# Patient Record
Sex: Male | Born: 1965 | Race: White | Hispanic: No | Marital: Married | Smoking: Never smoker
Health system: Southern US, Community
[De-identification: ages and names within clinical notes are randomized; demographics above are authoritative.]

## PROBLEM LIST (undated history)

## (undated) DIAGNOSIS — E039 Hypothyroidism, unspecified: Secondary | ICD-10-CM

## (undated) DIAGNOSIS — M2550 Pain in unspecified joint: Secondary | ICD-10-CM

## (undated) DIAGNOSIS — K760 Fatty (change of) liver, not elsewhere classified: Secondary | ICD-10-CM

## (undated) DIAGNOSIS — F419 Anxiety disorder, unspecified: Secondary | ICD-10-CM

## (undated) DIAGNOSIS — M549 Dorsalgia, unspecified: Secondary | ICD-10-CM

## (undated) DIAGNOSIS — M7989 Other specified soft tissue disorders: Secondary | ICD-10-CM

## (undated) DIAGNOSIS — R12 Heartburn: Secondary | ICD-10-CM

## (undated) DIAGNOSIS — E7800 Pure hypercholesterolemia, unspecified: Secondary | ICD-10-CM

## (undated) DIAGNOSIS — N289 Disorder of kidney and ureter, unspecified: Secondary | ICD-10-CM

## (undated) DIAGNOSIS — K5900 Constipation, unspecified: Secondary | ICD-10-CM

## (undated) DIAGNOSIS — R0602 Shortness of breath: Secondary | ICD-10-CM

## (undated) DIAGNOSIS — N200 Calculus of kidney: Secondary | ICD-10-CM

## (undated) DIAGNOSIS — R079 Chest pain, unspecified: Secondary | ICD-10-CM

## (undated) HISTORY — DX: Pure hypercholesterolemia, unspecified: E78.00

## (undated) HISTORY — DX: Other specified soft tissue disorders: M79.89

## (undated) HISTORY — DX: Fatty (change of) liver, not elsewhere classified: K76.0

## (undated) HISTORY — DX: Shortness of breath: R06.02

## (undated) HISTORY — DX: Anxiety disorder, unspecified: F41.9

## (undated) HISTORY — DX: Dorsalgia, unspecified: M54.9

## (undated) HISTORY — DX: Pain in unspecified joint: M25.50

## (undated) HISTORY — DX: Disorder of kidney and ureter, unspecified: N28.9

## (undated) HISTORY — DX: Hypothyroidism, unspecified: E03.9

## (undated) HISTORY — DX: Heartburn: R12

## (undated) HISTORY — PX: APPENDECTOMY: SHX54

## (undated) HISTORY — DX: Constipation, unspecified: K59.00

## (undated) HISTORY — PX: LITHOTRIPSY: SUR834

## (undated) HISTORY — PX: HERNIA REPAIR: SHX51

## (undated) HISTORY — DX: Chest pain, unspecified: R07.9

---

## 1999-07-01 ENCOUNTER — Encounter: Payer: Self-pay | Admitting: Urology

## 1999-07-01 ENCOUNTER — Ambulatory Visit (HOSPITAL_COMMUNITY): Admission: RE | Admit: 1999-07-01 | Discharge: 1999-07-01 | Payer: Self-pay | Admitting: Urology

## 1999-07-20 ENCOUNTER — Encounter: Payer: Self-pay | Admitting: Urology

## 1999-07-20 ENCOUNTER — Encounter: Admission: RE | Admit: 1999-07-20 | Discharge: 1999-07-20 | Payer: Self-pay | Admitting: Urology

## 1999-11-01 ENCOUNTER — Encounter: Payer: Self-pay | Admitting: Urology

## 1999-11-01 ENCOUNTER — Encounter: Admission: RE | Admit: 1999-11-01 | Discharge: 1999-11-01 | Payer: Self-pay | Admitting: Urology

## 1999-11-09 ENCOUNTER — Encounter: Admission: RE | Admit: 1999-11-09 | Discharge: 1999-11-09 | Payer: Self-pay | Admitting: Urology

## 1999-11-09 ENCOUNTER — Encounter: Payer: Self-pay | Admitting: Urology

## 2000-05-02 ENCOUNTER — Encounter: Admission: RE | Admit: 2000-05-02 | Discharge: 2000-05-02 | Payer: Self-pay | Admitting: Urology

## 2000-05-02 ENCOUNTER — Encounter: Payer: Self-pay | Admitting: Urology

## 2001-11-06 ENCOUNTER — Encounter: Admission: RE | Admit: 2001-11-06 | Discharge: 2001-11-06 | Payer: Self-pay | Admitting: Urology

## 2001-11-06 ENCOUNTER — Encounter: Payer: Self-pay | Admitting: Urology

## 2001-11-07 ENCOUNTER — Encounter: Admission: RE | Admit: 2001-11-07 | Discharge: 2001-11-07 | Payer: Self-pay | Admitting: Urology

## 2001-11-07 ENCOUNTER — Encounter: Payer: Self-pay | Admitting: Urology

## 2001-11-12 ENCOUNTER — Ambulatory Visit (HOSPITAL_COMMUNITY): Admission: RE | Admit: 2001-11-12 | Discharge: 2001-11-12 | Payer: Self-pay | Admitting: Urology

## 2001-11-19 ENCOUNTER — Ambulatory Visit (HOSPITAL_BASED_OUTPATIENT_CLINIC_OR_DEPARTMENT_OTHER): Admission: RE | Admit: 2001-11-19 | Discharge: 2001-11-19 | Payer: Self-pay | Admitting: Urology

## 2001-11-19 ENCOUNTER — Encounter: Payer: Self-pay | Admitting: Urology

## 2001-11-30 ENCOUNTER — Encounter: Admission: RE | Admit: 2001-11-30 | Discharge: 2001-11-30 | Payer: Self-pay | Admitting: Urology

## 2001-11-30 ENCOUNTER — Encounter: Payer: Self-pay | Admitting: Urology

## 2001-12-21 ENCOUNTER — Encounter: Payer: Self-pay | Admitting: Urology

## 2001-12-21 ENCOUNTER — Encounter: Admission: RE | Admit: 2001-12-21 | Discharge: 2001-12-21 | Payer: Self-pay | Admitting: Urology

## 2002-02-21 ENCOUNTER — Encounter: Admission: RE | Admit: 2002-02-21 | Discharge: 2002-02-21 | Payer: Self-pay | Admitting: Urology

## 2002-02-21 ENCOUNTER — Encounter: Payer: Self-pay | Admitting: Urology

## 2002-02-28 ENCOUNTER — Ambulatory Visit (HOSPITAL_BASED_OUTPATIENT_CLINIC_OR_DEPARTMENT_OTHER): Admission: RE | Admit: 2002-02-28 | Discharge: 2002-02-28 | Payer: Self-pay | Admitting: Plastic Surgery

## 2002-02-28 ENCOUNTER — Encounter (INDEPENDENT_AMBULATORY_CARE_PROVIDER_SITE_OTHER): Payer: Self-pay | Admitting: Specialist

## 2002-04-19 ENCOUNTER — Encounter: Payer: Self-pay | Admitting: Urology

## 2002-04-19 ENCOUNTER — Encounter: Admission: RE | Admit: 2002-04-19 | Discharge: 2002-04-19 | Payer: Self-pay | Admitting: Urology

## 2002-11-05 ENCOUNTER — Encounter: Admission: RE | Admit: 2002-11-05 | Discharge: 2002-11-05 | Payer: Self-pay | Admitting: Urology

## 2002-11-05 ENCOUNTER — Encounter: Payer: Self-pay | Admitting: Urology

## 2006-02-23 ENCOUNTER — Observation Stay (HOSPITAL_COMMUNITY): Admission: EM | Admit: 2006-02-23 | Discharge: 2006-02-25 | Payer: Self-pay | Admitting: Orthopedic Surgery

## 2006-02-24 ENCOUNTER — Ambulatory Visit: Payer: Self-pay | Admitting: Internal Medicine

## 2007-10-29 ENCOUNTER — Ambulatory Visit (HOSPITAL_COMMUNITY): Admission: RE | Admit: 2007-10-29 | Discharge: 2007-10-29 | Payer: Self-pay | Admitting: Urology

## 2011-02-18 NOTE — Op Note (Signed)
[facility]  Patient:    MENA, LIENAU Visit Number: [id number] MRN: [id number]          Service Type: DSU Location: DAY Attending Physician:  Thermon Leyland Dictated by:   Barron Alvine, M.D. Proc. Date: 11/12/01 Admit Date:  11/12/2001                             Operative Report  PREOPERATIVE DIAGNOSES:  1. Left renal stone. 2. Right ureteral calculus.  POSTOPERATIVE DIAGNOSES:  1. Left renal stone. 2. Right ureteral calculus.  OPERATION/PROCEDURE:  Cystoscopy, bilateral retrograde pyelography, right-sided ureterostomy, right ureteral stone basketing, left double-J stent placement.  SURGEON:  Barron Alvine, M.D.  ANESTHESIA:  General  INDICATIONS:  This patient is a 45 year old male who has had recurrent nephrolithiasis.  He began having gross hematuria and initially had right flank pain.  He also has had some discomfort in his left flank as well.  A KUB revealed what appeared to be significant 15-20 mm calcification in what appeared to be the lower pole of his left kidney.  We saw no other evidence of calculi.  Since he was having right-sided pain, we went ahead with a noncontrast spiral CT.  This showed what appeared to be about a 5 mm stone at the junction of his mid and right distal ureter with some hydronephrosis.  The patient continued to have hematuria and pain.  He requested intervention.  We recommended ureterostomy for the right-sided ureteral stone with consideration for double-J stent placement to facilitate passage of fragments for the left stone.  He presents now for his ureterostomy and stent placed and is on the schedule for left-sided lithotripsy in one week.  TECHNIQUE AND FINDINGS:  The patient was brought to the operating room where he had successful induction of general anesthesia.  He was placed in the lithotomy position and prepped and draped in the usual manner.  Cystoscopy revealed no significant  abnormalities.  A retrograde pyelogram confirmed a small filling defect in the distal right ureter.  The left-sided renal stone could be seen in the lower pole calyx.  A guidewire was placed in the right renal pelvis.  A short ureteroscope was then engaged in the ureter without difficulty and a stone was encountered in the distal ureter.  It was basket extracted.  A small piece broke off which was then extracted as well, and overall the stone was probably 5-6 mm in size.  No complications occurred from that portion of the procedure.  Since it was relatively uneventful and there was no need for dilation, I elected not to place a double-J stent on the right.  On the left, I went ahead and placed a guidewire.  Over the guidewire I placed a 7 French, 26 cm, double-J stent without difficult with fluoroscopic as well as visual guidance.  The patient tolerated the procedure well.  He was given a BNS suppository and lidocaine jelly.  He was brought to the recovery room in stable condition. Dictated by:   Barron Alvine, M.D. Attending Physician:  Thermon Leyland DD:  11/12/01 TD:  11/12/01 Job: [id number]

## 2011-09-02 ENCOUNTER — Emergency Department (HOSPITAL_COMMUNITY): Payer: Non-veteran care

## 2011-09-02 ENCOUNTER — Emergency Department (HOSPITAL_COMMUNITY)
Admission: EM | Admit: 2011-09-02 | Discharge: 2011-09-02 | Disposition: A | Discharge status: Home / Self Care | Payer: Non-veteran care | Attending: Emergency Medicine | Admitting: Emergency Medicine

## 2011-09-02 ENCOUNTER — Encounter: Payer: Self-pay | Admitting: Emergency Medicine

## 2011-09-02 DIAGNOSIS — R109 Unspecified abdominal pain: Secondary | ICD-10-CM | POA: Insufficient documentation

## 2011-09-02 DIAGNOSIS — N201 Calculus of ureter: Secondary | ICD-10-CM | POA: Insufficient documentation

## 2011-09-02 DIAGNOSIS — Z9889 Other specified postprocedural states: Secondary | ICD-10-CM | POA: Insufficient documentation

## 2011-09-02 DIAGNOSIS — Z79899 Other long term (current) drug therapy: Secondary | ICD-10-CM | POA: Insufficient documentation

## 2011-09-02 HISTORY — DX: Calculus of kidney: N20.0

## 2011-09-02 LAB — URINE MICROSCOPIC-ADD ON

## 2011-09-02 LAB — URINALYSIS, ROUTINE W REFLEX MICROSCOPIC
Ketones, ur: 15 mg/dL — AB
Nitrite: NEGATIVE
Protein, ur: 100 mg/dL — AB
pH: 5 (ref 5.0–8.0)

## 2011-09-02 MED ORDER — ONDANSETRON HCL 4 MG PO TABS: 4.0000 mg | ORAL_TABLET | Freq: Four times a day (QID) | ORAL | Status: AC

## 2011-09-02 MED ORDER — TAMSULOSIN HCL 0.4 MG PO CAPS: 0.4000 mg | ORAL_CAPSULE | Freq: Every day | ORAL | Status: DC

## 2011-09-02 MED ORDER — OXYCODONE-ACETAMINOPHEN 5-325 MG PO TABS: 1.0000 | ORAL_TABLET | ORAL | Status: AC | PRN

## 2011-09-02 NOTE — ED Notes (Signed)
Pt w/hx of kidney stones to ED c/o flank pain and nausea.  Presently he denies pain, but states the pain was just like other episodes renal stones.

## 2011-09-02 NOTE — ED Notes (Signed)
Pt attempted to sign keypad, but pad did not work.

## 2011-09-02 NOTE — ED Notes (Signed)
PT. REPORTS LEFT FLANK PAIN ONSET THIS EVENING , DENIES HEMATURIA .

## 2011-09-02 NOTE — ED Provider Notes (Signed)
History     CSN: [id number] Arrival date & time: 09/02/2011  2:30 AM   First MD Initiated Contact with Patient 09/02/11 0247      Chief Complaint  Patient presents with  . Flank Pain    (Consider location/radiation/quality/duration/timing/severity/associated sxs/prior treatment) Patient is a 45 y.o. male presenting with flank pain. The history is provided by the patient.  Flank Pain   reports acute onset left flank pain with radiation down to his left groin that started today.  His pain was severe.  He took some of his "pain medicine" at home which seemed to improve his pain.  Now he is feeling much better at this time.  He's had no nausea or vomiting.  She denies diarrhea.  He denies hematuria or dysuria.  He denies fevers or chills.  He reports several prior kidney stones for which she follows up with a urologist.  He reports this feels similar to those episodes.  Nothing worsens the symptoms.  Nothing improves his symptoms.  His pain at this time is very mild  Past Medical History  Diagnosis Date  . Kidney stones     Past Surgical History  Procedure Date  . Appendectomy   . Hernia repair   . Lithotripsy     No family history on file.  History  Substance Use Topics  . Smoking status: Never Smoker   . Smokeless tobacco: Not on file  . Alcohol Use: Yes      Review of Systems  Genitourinary: Positive for flank pain.  All other systems reviewed and are negative.    Allergies  Review of patient's allergies indicates no known allergies.  Home Medications   Current Outpatient Rx  Name Route Sig Dispense Refill  . HYDROCHLOROTHIAZIDE 25 MG PO TABS Oral Take 25 mg by mouth daily.      Marland Kitchen ONDANSETRON HCL 4 MG PO TABS Oral Take 1 tablet (4 mg total) by mouth every 6 (six) hours. 12 tablet 0  . OXYCODONE-ACETAMINOPHEN 5-325 MG PO TABS Oral Take 1 tablet by mouth every 4 (four) hours as needed for pain. 30 tablet 0  . TAMSULOSIN HCL 0.4 MG PO CAPS Oral Take 1 capsule  (0.4 mg total) by mouth daily. 10 capsule 0    BP 114/74  Pulse 51  Temp(Src) 97.8 F (36.6 C) (Oral)  Resp 18  SpO2 95%  Physical Exam  Constitutional: He is oriented to person, place, and time. He appears well-developed and well-nourished.  HENT:  Head: Normocephalic.  Eyes: EOM are normal.  Neck: Normal range of motion.  Pulmonary/Chest: Effort normal.  Abdominal: He exhibits no distension. There is no tenderness.  Genitourinary:       No CVA tenderness  Musculoskeletal: Normal range of motion.  Neurological: He is alert and oriented to person, place, and time.  Psychiatric: He has a normal mood and affect.    ED Course  Procedures (including critical care time)  Labs Reviewed  URINALYSIS, ROUTINE W REFLEX MICROSCOPIC - Abnormal; Notable for the following:    Color, Urine AMBER (*) BIOCHEMICALS MAY BE AFFECTED BY COLOR   APPearance CLOUDY (*)    Hgb urine dipstick LARGE (*)    Bilirubin Urine SMALL (*)    Ketones, ur 15 (*)    Protein, ur 100 (*)    Leukocytes, UA SMALL (*)    All other components within normal limits  URINE MICROSCOPIC-ADD ON   Dg Abd 1 View  09/02/2011  *RADIOLOGY REPORT*  Clinical Data:  Left mid abdominal pain.  ABDOMEN - 1 VIEW  Comparison: Abdominal radiograph performed 01/19/2010  Findings: The visualized bowel gas pattern is unremarkable. Scattered air and stool filled loops of colon are seen; no abnormal dilatation of small bowel loops is seen to suggest small bowel obstruction.  No free intra-abdominal air is identified, though evaluation for free air is limited on a single supine view.  A small focal density overlying the left transverse process of L5, measuring 6 mm in size, raises suspicion for a left mid ureteral stone.  The visualized osseous structures are within normal limits; the sacroiliac joints are unremarkable in appearance.  IMPRESSION:  1.  Suspect 6 mm left mid ureteral stone, given clinical concern. 2.  Unremarkable bowel gas  pattern; no free intra-abdominal air seen.  Original Report Authenticated By: Tonia Ghent, M.D.   I personally reviewed his CT scan  1. Ureteral stone       MDM  The symptoms are typical for ureteral stone.  Given his multiple episodes in the past and his CT scans demonstrating stones in the past a KUB was obtained demonstrating what appears to be a 6 mm left mid ureteral stone.  This is consistent with his urine and his symptomatology.  His pain is controlled at this time.  DC home with close followup with his urologist.  Home on Percocet Zofran and Flomax.  She's been instructed to return to the [facility] long emergency department for worsening symptoms including but not limited to worsening pain fever or severe nausea vomiting        Lyanne Co, MD 09/02/11 [phone]

## 2011-09-02 NOTE — ED Notes (Signed)
Pt continues to deny pain.  VS stable.

## 2011-09-02 NOTE — ED Notes (Signed)
Patient transported to X-ray 

## 2011-09-03 LAB — URINE CULTURE: Colony Count: NO GROWTH

## 2017-11-17 ENCOUNTER — Ambulatory Visit (HOSPITAL_BASED_OUTPATIENT_CLINIC_OR_DEPARTMENT_OTHER): Payer: 59 | Admitting: Anesthesiology

## 2017-11-17 ENCOUNTER — Other Ambulatory Visit: Payer: Self-pay

## 2017-11-17 ENCOUNTER — Encounter (HOSPITAL_BASED_OUTPATIENT_CLINIC_OR_DEPARTMENT_OTHER): Payer: Self-pay | Admitting: *Deleted

## 2017-11-17 ENCOUNTER — Ambulatory Visit (HOSPITAL_BASED_OUTPATIENT_CLINIC_OR_DEPARTMENT_OTHER)
Admission: RE | Admit: 2017-11-17 | Discharge: 2017-11-17 | Disposition: A | Discharge status: Home / Self Care | Payer: 59 | Source: Ambulatory Visit | Attending: Urology | Admitting: Urology

## 2017-11-17 ENCOUNTER — Other Ambulatory Visit: Payer: Self-pay | Admitting: Urology

## 2017-11-17 ENCOUNTER — Encounter (HOSPITAL_BASED_OUTPATIENT_CLINIC_OR_DEPARTMENT_OTHER)
Admission: RE | Disposition: A | Discharge status: Home / Self Care | Payer: Self-pay | Source: Ambulatory Visit | Attending: Urology

## 2017-11-17 DIAGNOSIS — Z466 Encounter for fitting and adjustment of urinary device: Secondary | ICD-10-CM | POA: Diagnosis not present

## 2017-11-17 DIAGNOSIS — N132 Hydronephrosis with renal and ureteral calculous obstruction: Secondary | ICD-10-CM | POA: Insufficient documentation

## 2017-11-17 DIAGNOSIS — Z87442 Personal history of urinary calculi: Secondary | ICD-10-CM | POA: Diagnosis not present

## 2017-11-17 DIAGNOSIS — Z87891 Personal history of nicotine dependence: Secondary | ICD-10-CM | POA: Insufficient documentation

## 2017-11-17 HISTORY — PX: CYSTOSCOPY W/ URETERAL STENT PLACEMENT: SHX1429

## 2017-11-17 LAB — POCT I-STAT, CHEM 8
BUN: 17 mg/dL (ref 6–20)
Calcium, Ion: 1.17 mmol/L (ref 1.15–1.40)
Chloride: 101 mmol/L (ref 101–111)
Creatinine, Ser: 0.9 mg/dL (ref 0.61–1.24)
Glucose, Bld: 85 mg/dL (ref 65–99)
HEMATOCRIT: 47 % (ref 39.0–52.0)
HEMOGLOBIN: 16 g/dL (ref 13.0–17.0)
POTASSIUM: 3.6 mmol/L (ref 3.5–5.1)
Sodium: 141 mmol/L (ref 135–145)
TCO2: 28 mmol/L (ref 22–32)

## 2017-11-17 SURGERY — CYSTOSCOPY, WITH RETROGRADE PYELOGRAM AND URETERAL STENT INSERTION
Anesthesia: General | Site: Renal | Laterality: Bilateral

## 2017-11-17 MED ORDER — CEFAZOLIN SODIUM-DEXTROSE 2-3 GM-%(50ML) IV SOLR
INTRAVENOUS | Status: DC | PRN
  Administered 2017-11-17: 2 g via INTRAVENOUS

## 2017-11-17 MED ORDER — PROPOFOL 10 MG/ML IV BOLUS
INTRAVENOUS | Status: DC | PRN
  Administered 2017-11-17: 200 mg via INTRAVENOUS

## 2017-11-17 MED ORDER — KETOROLAC TROMETHAMINE 30 MG/ML IJ SOLN
INTRAMUSCULAR | Status: AC
  Filled 2017-11-17: qty 1

## 2017-11-17 MED ORDER — MIDAZOLAM HCL 5 MG/5ML IJ SOLN
INTRAMUSCULAR | Status: DC | PRN
  Administered 2017-11-17: 2 mg via INTRAVENOUS

## 2017-11-17 MED ORDER — PROPOFOL 10 MG/ML IV BOLUS
INTRAVENOUS | Status: AC
  Filled 2017-11-17: qty 40

## 2017-11-17 MED ORDER — ONDANSETRON HCL 4 MG/2ML IJ SOLN
INTRAMUSCULAR | Status: AC
  Filled 2017-11-17: qty 2

## 2017-11-17 MED ORDER — MIDAZOLAM HCL 2 MG/2ML IJ SOLN
INTRAMUSCULAR | Status: AC
  Filled 2017-11-17: qty 2

## 2017-11-17 MED ORDER — DEXAMETHASONE SODIUM PHOSPHATE 4 MG/ML IJ SOLN
INTRAMUSCULAR | Status: DC | PRN
  Administered 2017-11-17: 10 mg via INTRAVENOUS

## 2017-11-17 MED ORDER — LIDOCAINE 2% (20 MG/ML) 5 ML SYRINGE
INTRAMUSCULAR | Status: DC | PRN
  Administered 2017-11-17: 100 mg via INTRAVENOUS

## 2017-11-17 MED ORDER — FENTANYL CITRATE (PF) 100 MCG/2ML IJ SOLN
INTRAMUSCULAR | Status: AC
  Filled 2017-11-17: qty 2

## 2017-11-17 MED ORDER — METOCLOPRAMIDE HCL 5 MG/ML IJ SOLN
INTRAMUSCULAR | Status: DC | PRN
  Administered 2017-11-17: 10 mg via INTRAVENOUS

## 2017-11-17 MED ORDER — LIDOCAINE 2% (20 MG/ML) 5 ML SYRINGE
INTRAMUSCULAR | Status: AC
  Filled 2017-11-17: qty 10

## 2017-11-17 MED ORDER — TAMSULOSIN HCL 0.4 MG PO CAPS: 0.4000 mg | ORAL_CAPSULE | Freq: Every day | ORAL | 0 refills | Status: DC

## 2017-11-17 MED ORDER — CEFAZOLIN SODIUM-DEXTROSE 2-4 GM/100ML-% IV SOLN
2.0000 g | INTRAVENOUS | Status: DC
  Filled 2017-11-17: qty 100

## 2017-11-17 MED ORDER — PROMETHAZINE HCL 25 MG/ML IJ SOLN
6.2500 mg | INTRAMUSCULAR | Status: DC | PRN
  Filled 2017-11-17: qty 1

## 2017-11-17 MED ORDER — SODIUM CHLORIDE 0.9 % IR SOLN
Status: DC | PRN
  Administered 2017-11-17: 1000 mL

## 2017-11-17 MED ORDER — DEXAMETHASONE SODIUM PHOSPHATE 10 MG/ML IJ SOLN
INTRAMUSCULAR | Status: AC
  Filled 2017-11-17: qty 1

## 2017-11-17 MED ORDER — FENTANYL CITRATE (PF) 100 MCG/2ML IJ SOLN
INTRAMUSCULAR | Status: DC | PRN
  Administered 2017-11-17 (×2): 25 ug via INTRAVENOUS
  Administered 2017-11-17: 50 ug via INTRAVENOUS

## 2017-11-17 MED ORDER — IOHEXOL 300 MG/ML  SOLN
INTRAMUSCULAR | Status: DC | PRN
  Administered 2017-11-17: 10 mL

## 2017-11-17 MED ORDER — FENTANYL CITRATE (PF) 100 MCG/2ML IJ SOLN
25.0000 ug | INTRAMUSCULAR | Status: DC | PRN
  Filled 2017-11-17: qty 1

## 2017-11-17 MED ORDER — LACTATED RINGERS IV SOLN
INTRAVENOUS | Status: DC
  Administered 2017-11-17: 14:00:00 via INTRAVENOUS
  Filled 2017-11-17: qty 1000

## 2017-11-17 MED ORDER — CEFAZOLIN SODIUM-DEXTROSE 2-4 GM/100ML-% IV SOLN
INTRAVENOUS | Status: AC
  Filled 2017-11-17: qty 100

## 2017-11-17 MED ORDER — OXYCODONE-ACETAMINOPHEN 10-325 MG PO TABS: 1.0000 | ORAL_TABLET | ORAL | 0 refills | Status: DC | PRN

## 2017-11-17 MED ORDER — ONDANSETRON HCL 4 MG/2ML IJ SOLN
INTRAMUSCULAR | Status: DC | PRN
  Administered 2017-11-17: 4 mg via INTRAVENOUS

## 2017-11-17 MED ORDER — METOCLOPRAMIDE HCL 5 MG/ML IJ SOLN
INTRAMUSCULAR | Status: AC
  Filled 2017-11-17: qty 2

## 2017-11-17 MED ORDER — LIDOCAINE 2% (20 MG/ML) 5 ML SYRINGE
INTRAMUSCULAR | Status: AC
  Filled 2017-11-17: qty 5

## 2017-11-17 MED ORDER — KETOROLAC TROMETHAMINE 30 MG/ML IJ SOLN
INTRAMUSCULAR | Status: DC | PRN
  Administered 2017-11-17: 30 mg via INTRAVENOUS

## 2017-11-17 SURGICAL SUPPLY — 21 items
BAG DRAIN URO-CYSTO SKYTR STRL (DRAIN) ×4 IMPLANT
BAG DRN UROCATH (DRAIN) ×2
CATH INTERMIT  6FR 70CM (CATHETERS) ×1 IMPLANT
CLOTH BEACON ORANGE TIMEOUT ST (SAFETY) ×2 IMPLANT
GLOVE BIO SURGEON STRL SZ 6.5 (GLOVE) ×1 IMPLANT
GLOVE BIO SURGEON STRL SZ8 (GLOVE) ×2 IMPLANT
GLOVE BIOGEL PI IND STRL 6.5 (GLOVE) IMPLANT
GLOVE BIOGEL PI INDICATOR 6.5 (GLOVE) ×1
GOWN STRL REUS W/TWL LRG LVL3 (GOWN DISPOSABLE) ×3 IMPLANT
GOWN STRL REUS W/TWL XL LVL3 (GOWN DISPOSABLE) ×2 IMPLANT
GUIDEWIRE ANG ZIPWIRE 038X150 (WIRE) ×2 IMPLANT
GUIDEWIRE STR DUAL SENSOR (WIRE) ×1 IMPLANT
INFUSOR MANOMETER BAG 3000ML (MISCELLANEOUS) ×2 IMPLANT
IV NS IRRIG 3000ML ARTHROMATIC (IV SOLUTION) ×2 IMPLANT
KIT RM TURNOVER CYSTO AR (KITS) ×2 IMPLANT
MANIFOLD NEPTUNE II (INSTRUMENTS) ×1 IMPLANT
NS IRRIG 500ML POUR BTL (IV SOLUTION) ×2 IMPLANT
PACK CYSTO (CUSTOM PROCEDURE TRAY) ×2 IMPLANT
STENT URET 6FRX26 CONTOUR (STENTS) ×2 IMPLANT
SYRINGE 10CC LL (SYRINGE) ×2 IMPLANT
TUBE CONNECTING 12X1/4 (SUCTIONS) ×1 IMPLANT

## 2017-11-17 NOTE — Op Note (Signed)
Preoperative diagnosis: bilateral ureteral calculi  Postoperative diagnosis: Same  Procedure: 1 cystoscopy 2. bilateralretrograde pyelography 3.  Intraoperative fluoroscopy, under one hour, with interpretation 4.  bilateral 6 x 26 JJ stent exchange  Attending: Cleda MccreedyPatrick Mackenzie  Anesthesia: General  Estimated blood loss: None  Drains: bilateral 6 x 26 JJ ureteral stent without tether  Specimens: none  Antibiotics: ancef  Findings: bilateral ureteral stones. Mild bilateral hydronephrosis. No masses/lesions in the bladder. Ureteral orifices in normal anatomic location.  Indications: Patient is a 52 year old male with a history of bilateral ureteral stones and intractable pain. After discussing treatment options, they decided proceed with bilateral stent placement.  Procedure her in detail: The patient was brought to the operating room and a brief timeout was done to ensure correct patient, correct procedure, correct site.  General anesthesia was administered patient was placed in dorsal lithotomy position.  Her genitalia was then prepped and draped in usual sterile fashion.  A rigid 22 French cystoscope was passed in the urethra and the bladder.  Bladder was inspected free masses or lesions.  the ureteral orifices were in the normal orthotopic locations. a 6 french ureteral catheter was then instilled into the left ureteral orifice.  a gentle retrograde was obtained and findings noted above. We then advanced a zipwire up to the renal pelvis. We then placed a 6 x 26 double-j ureteral stent over the zip wire. We then removed the wire and good coil was noted in the the renal pelvis under fluoroscopy and the bladder under direct vision.  We then turned out attention to the right side.  a gentle retrograde was obtained and findings noted above. We then advanced a zipwire up to the renal pelvis. we then placed a 6 x 26 double-j ureteral stent over the original zip wire.  We then removed the wire and  good coil was noted in the the renal pelvis under fluoroscopy and the bladder under direct vision.  the bladder was then drained and this concluded the procedure which was well tolerated by patient.  Complications: None  Condition: Stable, extubated, transferred to PACU  Plan: Patient is to be discharged home as to follow-up in 2 weeks for stone extraction.

## 2017-11-17 NOTE — Discharge Instructions (Signed)
Ureteral Stent Implantation, Care After °Refer to this sheet in the next few weeks. These instructions provide you with information about caring for yourself after your procedure. Your health care provider may also give you more specific instructions. Your treatment has been planned according to current medical practices, but problems sometimes occur. Call your health care provider if you have any problems or questions after your procedure. °What can I expect after the procedure? °After the procedure, it is common to have: °· Nausea. °· Mild pain when you urinate. You may feel this pain in your lower back or lower abdomen. Pain should stop within a few minutes after you urinate. This may last for up to 1 week. °· A small amount of blood in your urine for several days. ° °Follow these instructions at home: ° °Medicines °· Take over-the-counter and prescription medicines only as told by your health care provider. °· If you were prescribed an antibiotic medicine, take it as told by your health care provider. Do not stop taking the antibiotic even if you start to feel better. °· Do not drive for 24 hours if you received a sedative. °· Do not drive or operate heavy machinery while taking prescription pain medicines. °Activity °· Return to your normal activities as told by your health care provider. Ask your health care provider what activities are safe for you. °· Do not lift anything that is heavier than 10 lb (4.5 kg). Follow this limit for 1 week after your procedure, or for as long as told by your health care provider. °General instructions °· Watch for any blood in your urine. Call your health care provider if the amount of blood in your urine increases. °· If you have a catheter: °? Follow instructions from your health care provider about taking care of your catheter and collection bag. °? Do not take baths, swim, or use a hot tub until your health care provider approves. °· Drink enough fluid to keep your urine  clear or pale yellow. °· Keep all follow-up visits as told by your health care provider. This is important. °Contact a health care provider if: °· You have pain that gets worse or does not get better with medicine, especially pain when you urinate. °· You have difficulty urinating. °· You feel nauseous or you vomit repeatedly during a period of more than 2 days after the procedure. °Get help right away if: °· Your urine is dark red or has blood clots in it. °· You are leaking urine (have incontinence). °· The end of the stent comes out of your urethra. °· You cannot urinate. °· You have sudden, sharp, or severe pain in your abdomen or lower back. °· You have a fever. °This information is not intended to replace advice given to you by your health care provider. Make sure you discuss any questions you have with your health care provider. °Document Released: 05/22/2013 Document Revised: 02/25/2016 Document Reviewed: 04/03/2015 °Elsevier Interactive Patient Education © 2018 [employer] ° ° °Post Anesthesia Home Care Instructions ° °Activity: °Get plenty of rest for the remainder of the day. A responsible individual must stay with you for 24 hours following the procedure.  °For the next 24 hours, DO NOT: °-Drive a car °-Operate machinery °-Drink alcoholic beverages °-Take any medication unless instructed by your physician °-Make any legal decisions or sign important papers. ° °Meals: °Start with liquid foods such as gelatin or soup. Progress to regular foods as tolerated. Avoid greasy, spicy, heavy foods. If nausea   and/or vomiting occur, drink only clear liquids until the nausea and/or vomiting subsides. Call your physician if vomiting continues. ° °Special Instructions/Symptoms: °Your throat may feel dry or sore from the anesthesia or the breathing tube placed in your throat during surgery. If this causes discomfort, gargle with warm salt water. The discomfort should disappear within 24 hours. ° °If you had a  scopolamine patch placed behind your ear for the management of post- operative nausea and/or vomiting: ° °1. The medication in the patch is effective for 72 hours, after which it should be removed.  Wrap patch in a tissue and discard in the trash. Wash hands thoroughly with soap and water. °2. You may remove the patch earlier than 72 hours if you experience unpleasant side effects which may include dry mouth, dizziness or visual disturbances. °3. Avoid touching the patch. Wash your hands with soap and water after contact with the patch. °  ° °

## 2017-11-17 NOTE — Anesthesia Postprocedure Evaluation (Signed)
Anesthesia Post Note  Patient: Brett Wilcox  Procedure(s) Performed: CYSTOSCOPY WITH RETROGRADE PYELOGRAM/URETERAL STENT PLACEMENT (Bilateral Renal)     Patient location during evaluation: PACU Anesthesia Type: General Level of consciousness: awake and alert Pain management: pain level controlled Vital Signs Assessment: post-procedure vital signs reviewed and stable Respiratory status: spontaneous breathing, nonlabored ventilation, respiratory function stable and patient connected to nasal cannula oxygen Cardiovascular status: blood pressure returned to baseline and stable Postop Assessment: no apparent nausea or vomiting Anesthetic complications: no    Last Vitals:  Vitals:   11/17/17 1615 11/17/17 1700  BP: 117/75 132/72  Pulse: 71 63  Resp: 14 16  Temp:  36.6 C  SpO2: 97% 98%    Last Pain:  Vitals:   11/17/17 1300  TempSrc: Oral  PainSc:                  Lacye Mccarn

## 2017-11-17 NOTE — Progress Notes (Signed)
Iv attempted by stephanie flippin rn.right hand

## 2017-11-17 NOTE — H&P (Signed)
Urology Admission H&P  Chief Complaint: bilateral flank pain  History of Present Illness: Brett Wilcox is a 52yo with a hx of nephrolithiasis who developed left flank pain 3 days ago and right flank pain today. CT from my office shows bilateral ureteral calculi. The patient denies fevers/chills.sweats. No nause or vomiting. No LUTS  Past Medical History:  Diagnosis Date  . Kidney stones    Past Surgical History:  Procedure Laterality Date  . APPENDECTOMY    . HERNIA REPAIR    . LITHOTRIPSY      Home Medications:  Current Facility-Administered Medications  Medication Dose Route Frequency Provider Last Rate Last Dose  . lactated ringers infusion   Intravenous Continuous Marcene DuosFitzgerald, Robert, MD 50 mL/hr at 11/17/17 1330     Allergies: No Known Allergies  History reviewed. No pertinent family history. Social History:  reports that  has never smoked. He has quit using smokeless tobacco. He reports that he drinks alcohol. He reports that he does not use drugs.  Review of Systems  Genitourinary: Positive for flank pain.  All other systems reviewed and are negative.   Physical Exam:  Vital signs in last 24 hours: Temp:  [98.5 F (36.9 C)] 98.5 F (36.9 C) (02/15 1259) Pulse Rate:  [65] 65 (02/15 1259) Resp:  [18] 18 (02/15 1259) BP: (127)/(78) 127/78 (02/15 1259) SpO2:  [100 %] 100 % (02/15 1259) Weight:  [98.7 kg (217 lb 9.6 oz)] 98.7 kg (217 lb 9.6 oz) (02/15 1259) Physical Exam  Constitutional: He is oriented to person, place, and time. He appears well-developed and well-nourished.  HENT:  Head: Normocephalic and atraumatic.  Eyes: EOM are normal. Pupils are equal, round, and reactive to light.  Neck: Normal range of motion. No thyromegaly present.  Cardiovascular: Normal rate and regular rhythm.  Respiratory: Effort normal. No respiratory distress.  GI: Soft.  Musculoskeletal: Normal range of motion. He exhibits no edema.  Neurological: He is alert and oriented to  person, place, and time.  Skin: Skin is warm and dry.  Psychiatric: He has a normal mood and affect. His behavior is normal. Judgment and thought content normal.    Laboratory Data:  Results for orders placed or performed during the hospital encounter of 11/17/17 (from the past 24 hour(s))  I-STAT, chem 8     Status: None   Collection Time: 11/17/17  1:31 PM  Result Value Ref Range   Sodium 141 135 - 145 mmol/L   Potassium 3.6 3.5 - 5.1 mmol/L   Chloride 101 101 - 111 mmol/L   BUN 17 6 - 20 mg/dL   Creatinine, Ser 1.190.90 0.61 - 1.24 mg/dL   Glucose, Bld 85 65 - 99 mg/dL   Calcium, Ion 1.471.17 8.291.15 - 1.40 mmol/L   TCO2 28 22 - 32 mmol/L   Hemoglobin 16.0 13.0 - 17.0 g/dL   HCT 56.247.0 13.039.0 - 86.552.0 %   No results found for this or any previous visit (from the past 240 hour(s)). Creatinine: Recent Labs    11/17/17 1331  CREATININE 0.90   Baseline Creatinine: 0.9  Impression/Assessment:  51yo with bilateral ureteral calculi  Plan:  The risks/benefits/alternaitves to bilateral ureteral stent placement was explained to the patient and he understands and wishes to proceed with surgery  Wilkie AyePatrick McKenzie 11/17/2017, 2:57 PM

## 2017-11-17 NOTE — Anesthesia Procedure Notes (Addendum)
Procedure Name: LMA Insertion Date/Time: 11/17/2017 3:10 PM Performed by: Bethena Midgetddono, Ernest, MD Pre-anesthesia Checklist: Patient identified, Emergency Drugs available, Suction available and Patient being monitored Patient Re-evaluated:Patient Re-evaluated prior to induction Oxygen Delivery Method: Circle system utilized Preoxygenation: Pre-oxygenation with 100% oxygen Induction Type: IV induction Ventilation: Mask ventilation without difficulty LMA: LMA inserted LMA Size: 5.0 Number of attempts: 1 Airway Equipment and Method: Bite block Placement Confirmation: positive ETCO2 Tube secured with: Tape Dental Injury: Teeth and Oropharynx as per pre-operative assessment

## 2017-11-17 NOTE — Transfer of Care (Signed)
  Last Vitals:  Vitals:   11/17/17 1259 11/17/17 1546  BP: 127/78 (P) 113/76  Pulse: 65   Resp: 18 (P) 10  Temp: 36.9 C (P) 36.5 C  SpO2: 100%     Last Pain:  Vitals:   11/17/17 1300  TempSrc: Oral  PainSc:       Patients Stated Pain Goal: 0 (11/17/17 1254)  Immediate Anesthesia Transfer of Care Note  Patient: Brett Wilcox  Procedure(s) Performed: Procedure(s) (LRB): CYSTOSCOPY WITH RETROGRADE PYELOGRAM/URETERAL STENT PLACEMENT (Bilateral)  Patient Location: PACU  Anesthesia Type: General  Level of Consciousness: awake, alert  and oriented  Airway & Oxygen Therapy: Patient Spontanous Breathing and Patient connected to nasal cannula oxygen  Post-op Assessment: Report given to PACU RN and Post -op Vital signs reviewed and stable  Post vital signs: Reviewed and stable  Complications: No apparent anesthesia complications

## 2017-11-17 NOTE — Anesthesia Preprocedure Evaluation (Signed)
Anesthesia Evaluation  Patient identified by MRN, date of birth, ID band Patient awake    Reviewed: Allergy & Precautions, NPO status , Patient's Chart, lab work & pertinent test results  Airway Mallampati: II  TM Distance: >3 FB Neck ROM: Full    Dental  (+) Dental Advisory Given   Pulmonary neg pulmonary ROS,    breath sounds clear to auscultation       Cardiovascular negative cardio ROS   Rhythm:Regular Rate:Normal     Neuro/Psych negative neurological ROS     GI/Hepatic negative GI ROS, Neg liver ROS,   Endo/Other  negative endocrine ROS  Renal/GU Renal disease (bilateral ureteral stones)     Musculoskeletal   Abdominal   Peds  Hematology negative hematology ROS (+)   Anesthesia Other Findings   Reproductive/Obstetrics                             Anesthesia Physical Anesthesia Plan  ASA: I  Anesthesia Plan: General   Post-op Pain Management:    Induction: Intravenous  PONV Risk Score and Plan: 2 and Ondansetron, Treatment may vary due to age or medical condition and Dexamethasone  Airway Management Planned: LMA  Additional Equipment:   Intra-op Plan:   Post-operative Plan: Extubation in OR  Informed Consent: I have reviewed the patients History and Physical, chart, labs and discussed the procedure including the risks, benefits and alternatives for the proposed anesthesia with the patient or authorized representative who has indicated his/her understanding and acceptance.   Dental advisory given  Plan Discussed with: CRNA  Anesthesia Plan Comments:         Anesthesia Quick Evaluation

## 2017-11-20 ENCOUNTER — Encounter (HOSPITAL_BASED_OUTPATIENT_CLINIC_OR_DEPARTMENT_OTHER): Payer: Self-pay | Admitting: Urology

## 2018-05-25 ENCOUNTER — Ambulatory Visit: Admit: 2018-05-25 | Payer: Non-veteran care | Admitting: Surgery

## 2018-05-25 SURGERY — REPAIR, HERNIA, UMBILICAL, LAPAROSCOPIC
Anesthesia: General

## 2018-08-15 DIAGNOSIS — R5383 Other fatigue: Secondary | ICD-10-CM | POA: Diagnosis not present

## 2018-08-15 DIAGNOSIS — M1A00X0 Idiopathic chronic gout, unspecified site, without tophus (tophi): Secondary | ICD-10-CM | POA: Diagnosis not present

## 2018-08-15 DIAGNOSIS — R05 Cough: Secondary | ICD-10-CM | POA: Diagnosis not present

## 2018-08-15 DIAGNOSIS — R945 Abnormal results of liver function studies: Secondary | ICD-10-CM | POA: Diagnosis not present

## 2018-08-15 DIAGNOSIS — Z0000 Encounter for general adult medical examination without abnormal findings: Secondary | ICD-10-CM | POA: Diagnosis not present

## 2018-08-15 DIAGNOSIS — E782 Mixed hyperlipidemia: Secondary | ICD-10-CM | POA: Diagnosis not present

## 2018-08-15 DIAGNOSIS — K219 Gastro-esophageal reflux disease without esophagitis: Secondary | ICD-10-CM | POA: Diagnosis not present

## 2018-08-23 DIAGNOSIS — R945 Abnormal results of liver function studies: Secondary | ICD-10-CM | POA: Diagnosis not present

## 2018-09-11 DIAGNOSIS — R945 Abnormal results of liver function studies: Secondary | ICD-10-CM | POA: Diagnosis not present

## 2018-09-11 DIAGNOSIS — K219 Gastro-esophageal reflux disease without esophagitis: Secondary | ICD-10-CM | POA: Diagnosis not present

## 2018-09-11 DIAGNOSIS — E782 Mixed hyperlipidemia: Secondary | ICD-10-CM | POA: Diagnosis not present

## 2018-09-11 DIAGNOSIS — E6609 Other obesity due to excess calories: Secondary | ICD-10-CM | POA: Diagnosis not present

## 2018-11-27 DIAGNOSIS — Z3009 Encounter for other general counseling and advice on contraception: Secondary | ICD-10-CM | POA: Diagnosis not present

## 2019-01-04 DIAGNOSIS — Z302 Encounter for sterilization: Secondary | ICD-10-CM | POA: Diagnosis not present

## 2019-01-10 DIAGNOSIS — E782 Mixed hyperlipidemia: Secondary | ICD-10-CM | POA: Diagnosis not present

## 2019-01-10 DIAGNOSIS — R945 Abnormal results of liver function studies: Secondary | ICD-10-CM | POA: Diagnosis not present

## 2019-01-14 DIAGNOSIS — K219 Gastro-esophageal reflux disease without esophagitis: Secondary | ICD-10-CM | POA: Diagnosis not present

## 2019-01-14 DIAGNOSIS — R945 Abnormal results of liver function studies: Secondary | ICD-10-CM | POA: Diagnosis not present

## 2019-01-14 DIAGNOSIS — E782 Mixed hyperlipidemia: Secondary | ICD-10-CM | POA: Diagnosis not present

## 2019-03-18 DIAGNOSIS — R945 Abnormal results of liver function studies: Secondary | ICD-10-CM | POA: Diagnosis not present

## 2019-03-18 DIAGNOSIS — E782 Mixed hyperlipidemia: Secondary | ICD-10-CM | POA: Diagnosis not present

## 2019-04-10 DIAGNOSIS — L821 Other seborrheic keratosis: Secondary | ICD-10-CM | POA: Diagnosis not present

## 2019-04-10 DIAGNOSIS — L570 Actinic keratosis: Secondary | ICD-10-CM | POA: Diagnosis not present

## 2019-04-10 DIAGNOSIS — D2261 Melanocytic nevi of right upper limb, including shoulder: Secondary | ICD-10-CM | POA: Diagnosis not present

## 2019-04-10 DIAGNOSIS — D225 Melanocytic nevi of trunk: Secondary | ICD-10-CM | POA: Diagnosis not present

## 2019-04-10 DIAGNOSIS — L814 Other melanin hyperpigmentation: Secondary | ICD-10-CM | POA: Diagnosis not present

## 2019-04-19 DIAGNOSIS — R05 Cough: Secondary | ICD-10-CM | POA: Diagnosis not present

## 2019-04-19 DIAGNOSIS — Z20828 Contact with and (suspected) exposure to other viral communicable diseases: Secondary | ICD-10-CM | POA: Diagnosis not present

## 2019-04-19 DIAGNOSIS — Z1159 Encounter for screening for other viral diseases: Secondary | ICD-10-CM | POA: Diagnosis not present

## 2019-08-22 DIAGNOSIS — Z0000 Encounter for general adult medical examination without abnormal findings: Secondary | ICD-10-CM | POA: Diagnosis not present

## 2019-08-22 DIAGNOSIS — M1A00X0 Idiopathic chronic gout, unspecified site, without tophus (tophi): Secondary | ICD-10-CM | POA: Diagnosis not present

## 2019-08-22 DIAGNOSIS — E782 Mixed hyperlipidemia: Secondary | ICD-10-CM | POA: Diagnosis not present

## 2019-08-22 DIAGNOSIS — Z23 Encounter for immunization: Secondary | ICD-10-CM | POA: Diagnosis not present

## 2019-08-22 DIAGNOSIS — K219 Gastro-esophageal reflux disease without esophagitis: Secondary | ICD-10-CM | POA: Diagnosis not present

## 2019-08-22 DIAGNOSIS — N200 Calculus of kidney: Secondary | ICD-10-CM | POA: Diagnosis not present

## 2019-08-22 DIAGNOSIS — E6609 Other obesity due to excess calories: Secondary | ICD-10-CM | POA: Diagnosis not present

## 2019-08-22 DIAGNOSIS — Z125 Encounter for screening for malignant neoplasm of prostate: Secondary | ICD-10-CM | POA: Diagnosis not present

## 2019-08-26 ENCOUNTER — Other Ambulatory Visit: Payer: Self-pay | Admitting: Internal Medicine

## 2019-08-26 DIAGNOSIS — R7989 Other specified abnormal findings of blood chemistry: Secondary | ICD-10-CM

## 2019-09-04 ENCOUNTER — Ambulatory Visit
Admission: RE | Admit: 2019-09-04 | Discharge: 2019-09-04 | Disposition: A | Discharge status: Home / Self Care | Payer: BC Managed Care – PPO | Source: Ambulatory Visit | Attending: Internal Medicine | Admitting: Internal Medicine

## 2019-09-04 DIAGNOSIS — K7689 Other specified diseases of liver: Secondary | ICD-10-CM | POA: Diagnosis not present

## 2019-09-04 DIAGNOSIS — R7989 Other specified abnormal findings of blood chemistry: Secondary | ICD-10-CM

## 2019-10-23 DIAGNOSIS — Z23 Encounter for immunization: Secondary | ICD-10-CM | POA: Diagnosis not present

## 2019-10-25 DIAGNOSIS — R05 Cough: Secondary | ICD-10-CM | POA: Diagnosis not present

## 2019-10-25 DIAGNOSIS — U071 COVID-19: Secondary | ICD-10-CM | POA: Diagnosis not present

## 2019-10-25 DIAGNOSIS — R509 Fever, unspecified: Secondary | ICD-10-CM | POA: Diagnosis not present

## 2019-11-07 DIAGNOSIS — Z20828 Contact with and (suspected) exposure to other viral communicable diseases: Secondary | ICD-10-CM | POA: Diagnosis not present

## 2019-12-04 DIAGNOSIS — R945 Abnormal results of liver function studies: Secondary | ICD-10-CM | POA: Diagnosis not present

## 2019-12-10 DIAGNOSIS — R945 Abnormal results of liver function studies: Secondary | ICD-10-CM | POA: Diagnosis not present

## 2019-12-10 DIAGNOSIS — R7989 Other specified abnormal findings of blood chemistry: Secondary | ICD-10-CM | POA: Diagnosis not present

## 2019-12-15 DIAGNOSIS — Z23 Encounter for immunization: Secondary | ICD-10-CM | POA: Diagnosis not present

## 2020-02-24 DIAGNOSIS — R05 Cough: Secondary | ICD-10-CM | POA: Diagnosis not present

## 2020-02-24 DIAGNOSIS — Z87442 Personal history of urinary calculi: Secondary | ICD-10-CM | POA: Diagnosis not present

## 2020-02-24 DIAGNOSIS — R6882 Decreased libido: Secondary | ICD-10-CM | POA: Diagnosis not present

## 2020-02-24 DIAGNOSIS — R5383 Other fatigue: Secondary | ICD-10-CM | POA: Diagnosis not present

## 2020-05-07 DIAGNOSIS — L814 Other melanin hyperpigmentation: Secondary | ICD-10-CM | POA: Diagnosis not present

## 2020-05-07 DIAGNOSIS — L570 Actinic keratosis: Secondary | ICD-10-CM | POA: Diagnosis not present

## 2020-05-07 DIAGNOSIS — D225 Melanocytic nevi of trunk: Secondary | ICD-10-CM | POA: Diagnosis not present

## 2020-05-07 DIAGNOSIS — S80861A Insect bite (nonvenomous), right lower leg, initial encounter: Secondary | ICD-10-CM | POA: Diagnosis not present

## 2020-05-07 DIAGNOSIS — L821 Other seborrheic keratosis: Secondary | ICD-10-CM | POA: Diagnosis not present

## 2020-09-01 DIAGNOSIS — R739 Hyperglycemia, unspecified: Secondary | ICD-10-CM | POA: Diagnosis not present

## 2020-09-01 DIAGNOSIS — J309 Allergic rhinitis, unspecified: Secondary | ICD-10-CM | POA: Diagnosis not present

## 2020-09-01 DIAGNOSIS — N200 Calculus of kidney: Secondary | ICD-10-CM | POA: Diagnosis not present

## 2020-09-01 DIAGNOSIS — Z0000 Encounter for general adult medical examination without abnormal findings: Secondary | ICD-10-CM | POA: Diagnosis not present

## 2020-09-01 DIAGNOSIS — E039 Hypothyroidism, unspecified: Secondary | ICD-10-CM | POA: Diagnosis not present

## 2020-09-01 DIAGNOSIS — R946 Abnormal results of thyroid function studies: Secondary | ICD-10-CM | POA: Diagnosis not present

## 2020-09-01 DIAGNOSIS — M1A00X0 Idiopathic chronic gout, unspecified site, without tophus (tophi): Secondary | ICD-10-CM | POA: Diagnosis not present

## 2020-09-01 DIAGNOSIS — R6882 Decreased libido: Secondary | ICD-10-CM | POA: Diagnosis not present

## 2020-09-01 DIAGNOSIS — K760 Fatty (change of) liver, not elsewhere classified: Secondary | ICD-10-CM | POA: Diagnosis not present

## 2020-09-01 DIAGNOSIS — E782 Mixed hyperlipidemia: Secondary | ICD-10-CM | POA: Diagnosis not present

## 2020-09-01 DIAGNOSIS — Z23 Encounter for immunization: Secondary | ICD-10-CM | POA: Diagnosis not present

## 2020-09-11 DIAGNOSIS — E291 Testicular hypofunction: Secondary | ICD-10-CM | POA: Diagnosis not present

## 2020-10-13 DIAGNOSIS — E039 Hypothyroidism, unspecified: Secondary | ICD-10-CM | POA: Diagnosis not present

## 2020-11-26 DIAGNOSIS — E039 Hypothyroidism, unspecified: Secondary | ICD-10-CM | POA: Diagnosis not present

## 2020-12-15 DIAGNOSIS — E039 Hypothyroidism, unspecified: Secondary | ICD-10-CM | POA: Diagnosis not present

## 2020-12-15 DIAGNOSIS — E291 Testicular hypofunction: Secondary | ICD-10-CM | POA: Diagnosis not present

## 2020-12-15 DIAGNOSIS — E063 Autoimmune thyroiditis: Secondary | ICD-10-CM | POA: Diagnosis not present

## 2020-12-15 DIAGNOSIS — E669 Obesity, unspecified: Secondary | ICD-10-CM | POA: Diagnosis not present

## 2020-12-16 DIAGNOSIS — Z125 Encounter for screening for malignant neoplasm of prostate: Secondary | ICD-10-CM | POA: Diagnosis not present

## 2020-12-16 DIAGNOSIS — E291 Testicular hypofunction: Secondary | ICD-10-CM | POA: Diagnosis not present

## 2021-02-05 DIAGNOSIS — M109 Gout, unspecified: Secondary | ICD-10-CM | POA: Diagnosis not present

## 2021-02-05 DIAGNOSIS — N200 Calculus of kidney: Secondary | ICD-10-CM | POA: Diagnosis not present

## 2021-02-05 DIAGNOSIS — K219 Gastro-esophageal reflux disease without esophagitis: Secondary | ICD-10-CM | POA: Diagnosis not present

## 2021-02-05 DIAGNOSIS — E785 Hyperlipidemia, unspecified: Secondary | ICD-10-CM | POA: Diagnosis not present

## 2021-02-25 ENCOUNTER — Encounter (INDEPENDENT_AMBULATORY_CARE_PROVIDER_SITE_OTHER): Payer: Self-pay | Admitting: Family Medicine

## 2021-02-25 ENCOUNTER — Ambulatory Visit (INDEPENDENT_AMBULATORY_CARE_PROVIDER_SITE_OTHER): Payer: BC Managed Care – PPO | Admitting: Family Medicine

## 2021-02-25 ENCOUNTER — Other Ambulatory Visit: Payer: Self-pay

## 2021-02-25 VITALS — BP 135/76 | HR 79 | Temp 97.8°F | Ht 69.0 in | Wt 226.0 lb

## 2021-02-25 DIAGNOSIS — N200 Calculus of kidney: Secondary | ICD-10-CM | POA: Diagnosis not present

## 2021-02-25 DIAGNOSIS — K760 Fatty (change of) liver, not elsewhere classified: Secondary | ICD-10-CM | POA: Diagnosis not present

## 2021-02-25 DIAGNOSIS — Z0289 Encounter for other administrative examinations: Secondary | ICD-10-CM

## 2021-02-25 DIAGNOSIS — R5383 Other fatigue: Secondary | ICD-10-CM | POA: Diagnosis not present

## 2021-02-25 DIAGNOSIS — R0602 Shortness of breath: Secondary | ICD-10-CM | POA: Insufficient documentation

## 2021-02-25 DIAGNOSIS — E7849 Other hyperlipidemia: Secondary | ICD-10-CM | POA: Insufficient documentation

## 2021-02-25 DIAGNOSIS — Z1331 Encounter for screening for depression: Secondary | ICD-10-CM | POA: Diagnosis not present

## 2021-02-25 DIAGNOSIS — E039 Hypothyroidism, unspecified: Secondary | ICD-10-CM | POA: Insufficient documentation

## 2021-02-25 DIAGNOSIS — Z9189 Other specified personal risk factors, not elsewhere classified: Secondary | ICD-10-CM | POA: Insufficient documentation

## 2021-02-25 DIAGNOSIS — Z6833 Body mass index (BMI) 33.0-33.9, adult: Secondary | ICD-10-CM

## 2021-02-25 DIAGNOSIS — E038 Other specified hypothyroidism: Secondary | ICD-10-CM

## 2021-02-25 DIAGNOSIS — E669 Obesity, unspecified: Secondary | ICD-10-CM

## 2021-02-26 DIAGNOSIS — E669 Obesity, unspecified: Secondary | ICD-10-CM | POA: Diagnosis not present

## 2021-02-26 DIAGNOSIS — Z5181 Encounter for therapeutic drug level monitoring: Secondary | ICD-10-CM | POA: Diagnosis not present

## 2021-02-26 DIAGNOSIS — E291 Testicular hypofunction: Secondary | ICD-10-CM | POA: Diagnosis not present

## 2021-02-26 LAB — CBC WITH DIFFERENTIAL/PLATELET
Basophils Absolute: 0.1 10*3/uL (ref 0.0–0.2)
Basos: 1 %
EOS (ABSOLUTE): 0.2 10*3/uL (ref 0.0–0.4)
Eos: 2 %
Hematocrit: 55.9 % — ABNORMAL HIGH (ref 37.5–51.0)
Hemoglobin: 18.6 g/dL — ABNORMAL HIGH (ref 13.0–17.7)
Immature Grans (Abs): 0 10*3/uL (ref 0.0–0.1)
Immature Granulocytes: 1 %
Lymphocytes Absolute: 2 10*3/uL (ref 0.7–3.1)
Lymphs: 30 %
MCH: 30.1 pg (ref 26.6–33.0)
MCHC: 33.3 g/dL (ref 31.5–35.7)
MCV: 91 fL (ref 79–97)
Monocytes Absolute: 0.5 10*3/uL (ref 0.1–0.9)
Monocytes: 8 %
Neutrophils Absolute: 3.8 10*3/uL (ref 1.4–7.0)
Neutrophils: 58 %
Platelets: 268 10*3/uL (ref 150–450)
RBC: 6.18 x10E6/uL — ABNORMAL HIGH (ref 4.14–5.80)
RDW: 13.5 % (ref 11.6–15.4)
WBC: 6.6 10*3/uL (ref 3.4–10.8)

## 2021-02-26 LAB — HEMOGLOBIN A1C
Est. average glucose Bld gHb Est-mCnc: 128 mg/dL
Hgb A1c MFr Bld: 6.1 % — ABNORMAL HIGH (ref 4.8–5.6)

## 2021-02-26 LAB — COMPREHENSIVE METABOLIC PANEL
ALT: 44 IU/L (ref 0–44)
AST: 29 IU/L (ref 0–40)
Albumin/Globulin Ratio: 1.8 (ref 1.2–2.2)
Albumin: 4.7 g/dL (ref 3.8–4.9)
Alkaline Phosphatase: 64 IU/L (ref 44–121)
BUN/Creatinine Ratio: 18 (ref 9–20)
BUN: 17 mg/dL (ref 6–24)
Bilirubin Total: 0.6 mg/dL (ref 0.0–1.2)
CO2: 25 mmol/L (ref 20–29)
Calcium: 9.7 mg/dL (ref 8.7–10.2)
Chloride: 97 mmol/L (ref 96–106)
Creatinine, Ser: 0.96 mg/dL (ref 0.76–1.27)
Globulin, Total: 2.6 g/dL (ref 1.5–4.5)
Glucose: 115 mg/dL — ABNORMAL HIGH (ref 65–99)
Potassium: 4.5 mmol/L (ref 3.5–5.2)
Sodium: 138 mmol/L (ref 134–144)
Total Protein: 7.3 g/dL (ref 6.0–8.5)
eGFR: 95 mL/min/{1.73_m2} (ref >59)

## 2021-02-26 LAB — VITAMIN B12: Vitamin B-12: 387 pg/mL (ref 232–1245)

## 2021-02-26 LAB — LIPID PANEL
Chol/HDL Ratio: 3.9 ratio (ref 0.0–5.0)
Cholesterol, Total: 155 mg/dL (ref 100–199)
HDL: 40 mg/dL (ref >39)
LDL Chol Calc (NIH): 94 mg/dL (ref 0–99)
Triglycerides: 118 mg/dL (ref 0–149)
VLDL Cholesterol Cal: 21 mg/dL (ref 5–40)

## 2021-02-26 LAB — INSULIN, RANDOM: INSULIN: 23.8 u[IU]/mL (ref 2.6–24.9)

## 2021-02-26 LAB — FOLATE: Folate: 11.4 ng/mL (ref >3.0)

## 2021-02-26 LAB — TSH: TSH: 3.32 u[IU]/mL (ref 0.450–4.500)

## 2021-02-26 LAB — VITAMIN D 25 HYDROXY (VIT D DEFICIENCY, FRACTURES): Vit D, 25-Hydroxy: 25.7 ng/mL — ABNORMAL LOW (ref 30.0–100.0)

## 2021-02-26 LAB — T4, FREE: Free T4: 1.42 ng/dL (ref 0.82–1.77)

## 2021-03-09 NOTE — Progress Notes (Signed)
Dear Dr. Valentina Lucks,   Thank you for referring Brett Wilcox to our clinic. The following note includes my evaluation and treatment recommendations.  Chief Complaint:   OBESITY Brett Wilcox (MR# [id number]) is a 55 y.o. male who presents for evaluation and treatment of obesity and related comorbidities. Current BMI is Body mass index is 33.37 kg/m. Brett Wilcox has been struggling with his weight for many years and has been unsuccessful in either losing weight, maintaining weight loss, or reaching his healthy weight goal.  Brett Wilcox is currently in the action stage of change and ready to dedicate time achieving and maintaining a healthier weight. Brett Wilcox is interested in becoming our patient and working on intensive lifestyle modifications including (but not limited to) diet and exercise for weight loss.  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  He lives with his wife, [name].  Craves candy/sweets in the afternoon.  Snacks on cheese and crackers.  Drinks juice and ETOH (3 ETOH beverages per day).  Brett Wilcox's habits were reviewed today and are as follows: His family eats meals together, he thinks his family will eat healthier with him, his desired weight loss is 40 pounds, he started gaining weight 8 years ago, his heaviest weight ever was 220 pounds, he craves sweets, he skips breakfast frequently, he is frequently drinking liquids with calories, he frequently makes poor food choices and he struggles with emotional eating.  Depression Screen [name]'s Food and Mood (modified PHQ-9) score was 7.  Depression screen [facility] 2/9 02/25/2021  Decreased Interest 1  Down, Depressed, Hopeless 0  PHQ - 2 Score 1  Altered sleeping 1  Tired, decreased energy 3  Change in appetite 2  Feeling bad or failure about yourself  0  Trouble concentrating 0  Moving slowly or fidgety/restless 0  Suicidal thoughts 0  PHQ-9 Score 7  Difficult doing work/chores Not  difficult at all   Assessment/Plan:   Orders Placed This Encounter  Procedures   Vitamin B12   CBC with Differential/Platelet   Comprehensive metabolic panel   Folate   Hemoglobin A1c   Insulin, random   Lipid panel   VITAMIN D 25 Hydroxy (Vit-D Deficiency, Fractures)   TSH   T4, free   EKG 12-Lead    Medications Discontinued During This Encounter  Medication Reason   oxyCODONE-acetaminophen (PERCOCET) 10-325 MG tablet    tamsulosin (FLOMAX) 0.4 MG CAPS capsule     1. Other fatigue Early denies daytime somnolence and reports waking up still tired. Patent has a history of symptoms of morning fatigue and snoring. [name] generally gets 8 hours of sleep per night, and states that he has generally restful sleep. Snoring is present. Apneic episodes are not present. Epworth Sleepiness Score is 6.  Brett Wilcox does feel that his weight is causing his energy to be lower than it should be. Fatigue may be related to obesity, depression or many other causes. Labs will be ordered, and in the meanwhile, Keene will focus on self care including making healthy food choices, increasing physical activity and focusing on stress reduction.  Check EKG and labs today.  - EKG 12-Lead - Vitamin B12 - CBC with Differential/Platelet - Comprehensive metabolic panel - Folate - Hemoglobin A1c - Insulin, random - VITAMIN D 25 Hydroxy (Vit-D Deficiency, Fractures)  2. SOBOE (shortness of breath on exertion) Brett Wilcox notes increasing shortness of breath with exercising and seems to be worsening over time with weight gain. He notes getting out  of breath sooner with activity than he used to. This has gotten worse recently. Brett Ohmseyton denies shortness of breath at rest or orthopnea.  Brett Ohmseyton does feel that he gets out of breath more easily that he used to when he exercises. Brett Wilcox's shortness of breath appears to be obesity related and exercise induced. He has agreed to work on weight loss and gradually increase exercise  to treat his exercise induced shortness of breath. Will continue to monitor closely.  Check IC today.  3. Other hyperlipidemia Course: At goal. Lipid-lowering medications: Crestor 10 mg daily.  He was diagnosed about a year ago.  Plan: Dietary changes: Increase soluble fiber, decrease simple carbohydrates, decrease saturated fat. Exercise changes: Moderate to vigorous-intensity aerobic activity 150 minutes per week or as tolerated. We will continue to monitor along with PCP/specialists as it pertains to his weight loss journey.  Check labs today, as per below.  Lab Results  Component Value Date   CHOL 155 02/25/2021   HDL 40 02/25/2021   LDLCALC 94 02/25/2021   TRIG 118 02/25/2021   CHOLHDL 3.9 02/25/2021   Lab Results  Component Value Date   ALT 44 02/25/2021   AST 29 02/25/2021   ALKPHOS 64 02/25/2021   BILITOT 0.6 02/25/2021   The 10-year ASCVD risk score Brett Wilcox(Goff DC Jr., et al., 2013) is: 9.7%   Values used to calculate the score:     Age: [age] years     Sex: Male     Is Non-Hispanic African American: No     Diabetic: No     Tobacco smoker: Yes     Systolic Blood Pressure: 140 mmHg     Is BP treated: No     HDL Cholesterol: 40 mg/dL     Total Cholesterol: 155 mg/dL  - Lipid panel  4. Nephrolithiasis He has been on HCTZ for 10-15 years to help pass kidney stones.  He gets stones 4-5 times per year.  Plan:  Check labs today.  - Comprehensive metabolic panel  5. Other specified hypothyroidism Course: Controlled. Medication: Synthroid 75 mcg daily.   Plan: Patient was instructed not to take MVM or iron within 4 hours of taking thyroid medications.  We will continue to monitor alongside Endocrinology/PCP as it relates to his weight loss journey.  Will check TSH and free T4 today.  Lab Results  Component Value Date   TSH 3.320 02/25/2021   - TSH - T4, free  6. Non-alcoholic fatty liver disease NAFLD is an umbrella term that encompasses a disease spectrum that includes  steatosis (fat) without inflammation, steatohepatitis (NASH; fat + inflammation in a characteristic pattern), and cirrhosis. Bland steatosis is felt to be a benign condition, with extremely low to no risk of progression to cirrhosis, whereas NASH can progress to cirrhosis. The mainstay of treatment of NAFLD includes lifestyle modification to achieve weight loss, at least 7% of current body weight. Low carbohydrate diets can be beneficial in improving NAFLD liver histology. Additionally, exercise, even the absence of weight loss can have beneficial effects on the patient's metabolic profile and liver health.   Plan:  Will check labs today, as per below.  - Comprehensive metabolic panel - Lipid panel  7. Depression screening Brett Ohmseyton was screened for depression as part of his new patient workup.  PHQ-9 is 7.  Brett Ohmseyton had a positive depression screening. Depression is commonly associated with obesity and often results in emotional eating behaviors. We will monitor this closely and work on CBT to help improve  the non-hunger eating patterns. Referral to Psychology may be required if no improvement is seen as he continues in our clinic.  8. At risk for impaired metabolic function Due to Gregroy's current state of health and medical condition(s), he is at a significantly higher risk for impaired metabolic function.   At least 23 minutes was spent on counseling Tanis about these concerns today.  This places the patient at a much greater risk to subsequently develop cardio-pulmonary conditions that can negatively affect the patient's quality of life.  I stressed the importance of reversing these risks factors.  The initial goal is to lose at least 5-10% of starting weight to help reduce risk factors.  Counseling:  Intensive lifestyle modifications discussed with Nolton as the most appropriate first line treatment.  he will continue to work on diet, exercise, and weight loss efforts.  We will continue to reassess  these conditions on a fairly regular basis in an attempt to decrease the patient's overall morbidity and mortality.  9. Class 1 obesity with serious comorbidity and body mass index (BMI) of 33.0 to 33.9 in adult, unspecified obesity type  Avishai is currently in the action stage of change and his goal is to continue with weight loss efforts. I recommend Leviticus begin the structured treatment plan as follows:  He has agreed to the Category 3 Plan.  Exercise goals:  As is.    Behavioral modification strategies: increasing lean protein intake, decreasing simple carbohydrates, increasing water intake, decreasing liquid calories, meal planning and cooking strategies, keeping healthy foods in the home and planning for success.  He was informed of the importance of frequent follow-up visits to maximize his success with intensive lifestyle modifications for his multiple health conditions. He was informed we would discuss his lab results at his next visit unless there is a critical issue that needs to be addressed sooner. Goebel agreed to keep his next visit at the agreed upon time to discuss these results.  Objective:   Blood pressure 135/76, pulse 79, temperature 97.8 F (36.6 C), height 5\' 9"  (1.753 m), weight 226 lb (102.5 kg), SpO2 96 %. Body mass index is 33.37 kg/m.  EKG: Normal sinus rhythm, rate 77 bpm.  Indirect Calorimeter completed today shows a VO2 of 315 and a REE of 2191.  His calculated basal metabolic rate is 2192 thus his basal metabolic rate is better than expected.  General: Cooperative, alert, well developed, in no acute distress. HEENT: Conjunctivae and lids unremarkable. Cardiovascular: Regular rhythm.  Lungs: Normal work of breathing. Neurologic: No focal deficits.   Lab Results  Component Value Date   CREATININE 0.96 02/25/2021   BUN 17 02/25/2021   NA 138 02/25/2021   K 4.5 02/25/2021   CL 97 02/25/2021   CO2 25 02/25/2021   Lab Results  Component Value Date    ALT 44 02/25/2021   AST 29 02/25/2021   ALKPHOS 64 02/25/2021   BILITOT 0.6 02/25/2021   Lab Results  Component Value Date   HGBA1C 6.1 (H) 02/25/2021   Lab Results  Component Value Date   INSULIN 23.8 02/25/2021   Lab Results  Component Value Date   TSH 3.320 02/25/2021   Lab Results  Component Value Date   CHOL 155 02/25/2021   HDL 40 02/25/2021   LDLCALC 94 02/25/2021   TRIG 118 02/25/2021   CHOLHDL 3.9 02/25/2021   Lab Results  Component Value Date   WBC 6.6 02/25/2021   HGB 18.6 (H) 02/25/2021   HCT  55.9 (H) 02/25/2021   MCV 91 02/25/2021   PLT 268 02/25/2021   Attestation Statements:   This is the patient's first visit at [facility]. The patient's NEW PATIENT PACKET was reviewed at length. Included in the packet: current and past health history, medications, allergies, ROS, gynecologic history (women only), surgical history, family history, social history, weight history, weight loss surgery history (for those that have had weight loss surgery), nutritional evaluation, mood and food questionnaire, PHQ9, Epworth questionnaire, sleep habits questionnaire, patient life and health improvement goals questionnaire. These will all be scanned into the patient's chart under media.   During the visit, I independently reviewed the patient's EKG, bioimpedance scale results, and indirect calorimeter results. I used this information to tailor a meal plan for the patient that will help him to lose weight and will improve his obesity-related conditions going forward. I performed a medically necessary appropriate examination and/or evaluation. I discussed the assessment and treatment plan with the patient. The patient was provided an opportunity to ask questions and all were answered. The patient agreed with the plan and demonstrated an understanding of the instructions. Labs were ordered at this visit and will be reviewed at the next visit unless more critical results need  to be addressed immediately. Clinical information was updated and documented in the EMR.   I, Insurance claims handler, CMA, am acting as Energy manager for [employer], DO.  I have reviewed the above documentation for accuracy and completeness, and I agree with the above. [name], D.O.  The 21st Century Cures Act was signed into law in 2016 which includes the topic of electronic health records.  This provides immediate access to information in MyChart.  This includes consultation notes, operative notes, office notes, lab results and pathology reports.  If you have any questions about what you read please let us know at your next visit so we can discuss your concerns and take corrective action if need be.  We are right here with you.

## 2021-03-11 ENCOUNTER — Ambulatory Visit (INDEPENDENT_AMBULATORY_CARE_PROVIDER_SITE_OTHER): Payer: BC Managed Care – PPO | Admitting: Family Medicine

## 2021-03-11 ENCOUNTER — Encounter (INDEPENDENT_AMBULATORY_CARE_PROVIDER_SITE_OTHER): Payer: Self-pay | Admitting: Family Medicine

## 2021-03-11 ENCOUNTER — Other Ambulatory Visit: Payer: Self-pay

## 2021-03-11 VITALS — BP 140/82 | HR 86 | Temp 98.3°F | Ht 69.0 in | Wt 220.0 lb

## 2021-03-11 DIAGNOSIS — E559 Vitamin D deficiency, unspecified: Secondary | ICD-10-CM

## 2021-03-11 DIAGNOSIS — E038 Other specified hypothyroidism: Secondary | ICD-10-CM

## 2021-03-11 DIAGNOSIS — E7849 Other hyperlipidemia: Secondary | ICD-10-CM

## 2021-03-11 DIAGNOSIS — D582 Other hemoglobinopathies: Secondary | ICD-10-CM | POA: Diagnosis not present

## 2021-03-11 DIAGNOSIS — Z6833 Body mass index (BMI) 33.0-33.9, adult: Secondary | ICD-10-CM

## 2021-03-11 DIAGNOSIS — K760 Fatty (change of) liver, not elsewhere classified: Secondary | ICD-10-CM

## 2021-03-11 DIAGNOSIS — E669 Obesity, unspecified: Secondary | ICD-10-CM

## 2021-03-11 DIAGNOSIS — Z9189 Other specified personal risk factors, not elsewhere classified: Secondary | ICD-10-CM | POA: Diagnosis not present

## 2021-03-11 DIAGNOSIS — R7303 Prediabetes: Secondary | ICD-10-CM | POA: Diagnosis not present

## 2021-03-11 MED ORDER — VITAMIN D (ERGOCALCIFEROL) 1.25 MG (50000 UNIT) PO CAPS: 50000.0000 [IU] | ORAL_CAPSULE | ORAL | 0 refills | Status: DC

## 2021-03-16 DIAGNOSIS — E559 Vitamin D deficiency, unspecified: Secondary | ICD-10-CM | POA: Insufficient documentation

## 2021-03-16 DIAGNOSIS — R7303 Prediabetes: Secondary | ICD-10-CM | POA: Insufficient documentation

## 2021-03-16 DIAGNOSIS — D582 Other hemoglobinopathies: Secondary | ICD-10-CM | POA: Insufficient documentation

## 2021-03-16 DIAGNOSIS — K760 Fatty (change of) liver, not elsewhere classified: Secondary | ICD-10-CM | POA: Insufficient documentation

## 2021-03-16 DIAGNOSIS — Z9189 Other specified personal risk factors, not elsewhere classified: Secondary | ICD-10-CM | POA: Insufficient documentation

## 2021-03-16 DIAGNOSIS — E038 Other specified hypothyroidism: Secondary | ICD-10-CM | POA: Insufficient documentation

## 2021-03-18 NOTE — Progress Notes (Signed)
Chief Complaint:   OBESITY Shed is here to discuss his progress with his obesity treatment plan along with follow-up of his obesity related diagnoses.   Today's visit was #: 2 Starting weight: 226 lbs Starting date: 02/25/2021 Today's weight: 220 lbs Today's date: 03/11/2021 Weight change since last visit: 6 lbs Total lbs lost to date: 6 lbs Body mass index is 32.49 kg/m.  Total weight loss percentage to date: -2.65%  Interim History: Brett Wilcox is here today for his first follow-up office visit since starting the program with [place].  All blood work/ lab tests that were recently ordered by myself or an outside provider were reviewed with patient today per their request.   Extended time was spent counseling him on all new disease processes that were discovered or preexisting ones that are worsening.  he understands that many of these abnormalities will need to monitored regularly along with the current treatment plan of prudent dietary changes, in which we are making each and every office visit, to improve these health parameters.  We reviewed his new meal plan in detail and questions were answered.  Patient's food recall appears to be accurate and consistent with what is on plan when he is following it.   When eating on plan, his hunger and cravings are well controlled.    Current Meal Plan: the Category 3 Plan for 95% of the time.  Current Exercise Plan: Walking for 40 minutes 2 times per week.  Assessment/Plan:   Meds ordered this encounter  Medications   Vitamin D, Ergocalciferol, (DRISDOL) 1.25 MG (50000 UNIT) CAPS capsule    Sig: Take 1 capsule (50,000 Units total) by mouth every 7 (seven) days.    Dispense:  4 capsule    Refill:  0    1. Elevated hemoglobin (HCC) Prior H/H 16.4/48.1 on 09/01/2020, 17.0/48.2 on 07/16/2009, 17.6/48.8 on 02/12/19.  Plan:  Discussed labs with patient today.  H/H elevated from prior, but usually high normal range.  I recommend increasing  water intake and rechecking in 3 months or so with PCP or [place].  Likely secondary to testosterone treatment.  He should make treating provider aware of this.  2. Other hyperlipidemia Course: Controlled. Lipid-lowering medications: Crestor 10 mg daily.   Plan:  Discussed labs with patient today.  Essentially at goal but would like LDL to be less and HDL higher.  Continue medication, prudent nutritional plan, weight loss, exercise.  Dietary changes: Increase soluble fiber, decrease simple carbohydrates, decrease saturated fat. Exercise changes: Moderate to vigorous-intensity aerobic activity 150 minutes per week or as tolerated. We will continue to monitor along with PCP/specialists as it pertains to his weight loss journey.    Lab Results  Component Value Date   CHOL 155 02/25/2021   HDL 40 02/25/2021   LDLCALC 94 02/25/2021   TRIG 118 02/25/2021   CHOLHDL 3.9 02/25/2021   Lab Results  Component Value Date   ALT 44 02/25/2021   AST 29 02/25/2021   ALKPHOS 64 02/25/2021   BILITOT 0.6 02/25/2021    3. Prediabetes Not at goal. Goal is HgbA1c < 5.7.  Medication: None.  Never diagnosed prior.  A1c 6.1.  elevated fasting insulin.  Plan:  New.  Discussed labs with patient today.  Consider weight loss/prediabetes medications in the future as needed.  Handouts given, educated extensively, prudent nutritional plan and weight loss.  Recheck 3-4 months.  He will continue to focus on protein-rich, low simple carbohydrate foods. We reviewed the  importance of hydration, regular exercise for stress reduction, and restorative sleep.   Lab Results  Component Value Date   HGBA1C 6.1 (H) 02/25/2021   Lab Results  Component Value Date   INSULIN 23.8 02/25/2021   4. Other specified hypothyroidism Course: Controlled. Medication: levothyroxine 75 mcg daily.  Asymptomatic.  No issues.  Plan:  Discussed labs with patient today.  Patient was instructed not to take MVM or iron within 4 hours of taking  thyroid medications.  We will continue to monitor alongside Endocrinology/PCP as it relates to his weight loss journey.  Continue medications.  within normal limits.  Stable.  Lab Results  Component Value Date   TSH 3.320 02/25/2021   5. NAFLD (nonalcoholic fatty liver disease) Labs reviewed with pt.  Upper limits of normal at ALT 44.  Historically has about 3 alcoholic drinks nightly.   NAFLD is an umbrella term that encompasses a disease spectrum that includes steatosis (fat) without inflammation, steatohepatitis (NASH; fat + inflammation in a characteristic pattern), and cirrhosis. Bland steatosis is felt to be a benign condition, with extremely low to no risk of progression to cirrhosis, whereas NASH can progress to cirrhosis. The mainstay of treatment of NAFLD includes lifestyle modification to achieve weight loss, at least 7% of current body weight. Low carbohydrate diets can be beneficial in improving NAFLD liver histology. Additionally, exercise, even the absence of weight loss can have beneficial effects on the patient's metabolic profile and liver health.    6. Vitamin D deficiency Not at goal. Current vitamin D is 25, tested on 02/25/2021. Optimal goal > 50 ng/dL.   Plan:  New.  Discussed labs with patient today.  Start to take prescription Vitamin D @50 ,000 IU every week as prescribed.  Follow-up for routine testing of Vitamin D, at least 2-3 times per year to avoid over-replacement.  - Start Vitamin D, Ergocalciferol, (DRISDOL) 1.25 MG (50000 UNIT) CAPS capsule; Take 1 capsule (50,000 Units total) by mouth every 7 (seven) days.  Dispense: 4 capsule; Refill: 0  7. At risk for diabetes mellitus - Brett Wilcox was given diabetes prevention education and counseling today of more than 23 minutes.  - Counseled patient on pathophysiology of disease and meaning/ implication of lab results.  - Reviewed how certain foods can either stimulate or inhibit insulin release, and subsequently affect hunger  pathways  - Importance of following a healthy meal plan with limiting amounts of simple carbohydrates discussed with patient - Effects of regular aerobic exercise on blood sugar regulation reviewed and encouraged an eventual goal of 30 min 5d/week or more as a minimum.  - Briefly discussed treatment options, which always include dietary and lifestyle modification as first line.   - Handouts provided at patient's desire and/or told to go online to the American Diabetes Association website for further information.  8. Class 1 obesity with serious comorbidity and body mass index (BMI) of 33.0 to 33.9 in adult, unspecified obesity type  Course: Bascom is currently in the action stage of change. As such, his goal is to continue with weight loss efforts.   Nutrition goals: He has agreed to the Category 3 Plan.   Exercise goals:  As is.  Behavioral modification strategies: increasing lean protein intake, decreasing simple carbohydrates, increasing water intake, decreasing liquid calories, decreasing alcohol intake, and planning for success.  Huston has agreed to follow-up with our clinic in 2-3 weeks. He was informed of the importance of frequent follow-up visits to maximize his success with intensive lifestyle modifications  for his multiple health conditions.   Objective:   Blood pressure 140/82, pulse 86, temperature 98.3 F (36.8 C), height 5\' 9"  (1.753 m), weight 220 lb (99.8 kg), SpO2 98 %. Body mass index is 32.49 kg/m.  General: Cooperative, alert, well developed, in no acute distress. HEENT: Conjunctivae and lids unremarkable. Cardiovascular: Regular rhythm.  Lungs: Normal work of breathing. Neurologic: No focal deficits.   Lab Results  Component Value Date   CREATININE 0.96 02/25/2021   BUN 17 02/25/2021   NA 138 02/25/2021   K 4.5 02/25/2021   CL 97 02/25/2021   CO2 25 02/25/2021   Lab Results  Component Value Date   ALT 44 02/25/2021   AST 29 02/25/2021   ALKPHOS 64  02/25/2021   BILITOT 0.6 02/25/2021   Lab Results  Component Value Date   HGBA1C 6.1 (H) 02/25/2021   Lab Results  Component Value Date   INSULIN 23.8 02/25/2021   Lab Results  Component Value Date   TSH 3.320 02/25/2021   Lab Results  Component Value Date   CHOL 155 02/25/2021   HDL 40 02/25/2021   LDLCALC 94 02/25/2021   TRIG 118 02/25/2021   CHOLHDL 3.9 02/25/2021   Lab Results  Component Value Date   WBC 6.6 02/25/2021   HGB 18.6 (H) 02/25/2021   HCT 55.9 (H) 02/25/2021   MCV 91 02/25/2021   PLT 268 02/25/2021   Attestation Statements:   Reviewed by clinician on day of visit: allergies, medications, problem list, medical history, surgical history, family history, social history, and previous encounter notes.  I, 02/27/2021, CMA, am acting as Insurance claims handler for Energy manager, DO.  I have reviewed the above documentation for accuracy and completeness, and I agree with the above. [employer], D.O.  The 21st Century Cures Act was signed into law in 2016 which includes the topic of electronic health records.  This provides immediate access to information in MyChart.  This includes consultation notes, operative notes, office notes, lab results and pathology reports.  If you have any questions about what you read please let 2017 know at your next visit so we can discuss your concerns and take corrective action if need be.  We are right here with you.

## 2021-03-30 DIAGNOSIS — E291 Testicular hypofunction: Secondary | ICD-10-CM | POA: Diagnosis not present

## 2021-03-30 DIAGNOSIS — Z5181 Encounter for therapeutic drug level monitoring: Secondary | ICD-10-CM | POA: Diagnosis not present

## 2021-03-30 DIAGNOSIS — Z125 Encounter for screening for malignant neoplasm of prostate: Secondary | ICD-10-CM | POA: Diagnosis not present

## 2021-04-01 ENCOUNTER — Encounter (INDEPENDENT_AMBULATORY_CARE_PROVIDER_SITE_OTHER): Payer: Self-pay | Admitting: Family Medicine

## 2021-04-01 ENCOUNTER — Ambulatory Visit (INDEPENDENT_AMBULATORY_CARE_PROVIDER_SITE_OTHER): Payer: BC Managed Care – PPO | Admitting: Family Medicine

## 2021-04-01 ENCOUNTER — Other Ambulatory Visit: Payer: Self-pay

## 2021-04-01 VITALS — BP 127/81 | HR 68 | Temp 97.5°F | Ht 69.0 in | Wt 213.0 lb

## 2021-04-01 DIAGNOSIS — Z9189 Other specified personal risk factors, not elsewhere classified: Secondary | ICD-10-CM | POA: Diagnosis not present

## 2021-04-01 DIAGNOSIS — M109 Gout, unspecified: Secondary | ICD-10-CM | POA: Diagnosis not present

## 2021-04-01 DIAGNOSIS — E559 Vitamin D deficiency, unspecified: Secondary | ICD-10-CM

## 2021-04-01 DIAGNOSIS — E669 Obesity, unspecified: Secondary | ICD-10-CM

## 2021-04-01 DIAGNOSIS — Z6833 Body mass index (BMI) 33.0-33.9, adult: Secondary | ICD-10-CM | POA: Diagnosis not present

## 2021-04-01 MED ORDER — VITAMIN D (ERGOCALCIFEROL) 1.25 MG (50000 UNIT) PO CAPS: 50000.0000 [IU] | ORAL_CAPSULE | ORAL | 0 refills | Status: DC

## 2021-04-12 NOTE — Progress Notes (Signed)
Chief Complaint:   OBESITY Brett Wilcox is here to discuss his progress with his obesity treatment plan along with follow-up of his obesity related diagnoses.   Today's visit was #: 3 Starting weight: 226 lbs Starting date: 02/25/2021 Today's weight: 213 lbs Today's date: 04/01/2021 Weight change since last visit: 7 lbs Total lbs lost to date: 13 lbs Body mass index is 31.45 kg/m.  Total weight loss percentage to date: -5.75%  Interim History:  Food recall is accurate.  Eating more shellfish now than prior including shrimp most nights to make sure he is hitting his protein goals.  He has had some questionable gout symptoms in his 1st toe.  For snack calories, he is having ETOH drinks, cheese, and fruit.  Current Meal Plan: the Category 3 Plan for 90-95% of the time.  Current Exercise Plan: Walking and playing tennis for 45-100 minutes 3 times per week.  Assessment/Plan:   Medications Discontinued During This Encounter  Medication Reason   Vitamin D, Ergocalciferol, (DRISDOL) 1.25 MG (50000 UNIT) CAPS capsule Reorder   Meds ordered this encounter  Medications   Vitamin D, Ergocalciferol, (DRISDOL) 1.25 MG (50000 UNIT) CAPS capsule    Sig: Take 1 capsule (50,000 Units total) by mouth every 7 (seven) days.    Dispense:  4 capsule    Refill:  0   1. Vitamin D deficiency Not at goal.  He is taking vitamin D 50,000 IU weekly.  Plan: Continue to take prescription Vitamin D @50 ,000 IU every week as prescribed.  Follow-up for routine testing of Vitamin D, at least 2-3 times per year to avoid over-replacement.  Lab Results  Component Value Date   VD25OH 25.7 (L) 02/25/2021   - Refill Vitamin D, Ergocalciferol, (DRISDOL) 1.25 MG (50000 UNIT) CAPS capsule; Take 1 capsule (50,000 Units total) by mouth every 7 (seven) days.  Dispense: 4 capsule; Refill: 0  2. Gouty arthropathy Pain came on in the first digit of foot 1.5 weeks ago.  No injury.  Positive history of gout and same  symptoms occurred, but this time was not swollen, hot, or red.  Symptoms are resolved now.  Plan:  Counseling done.  Likely gouty arthropathy.  Decrease intake of shellfish and decrease high purine foods.  Discussed in detail with patient what foods to decrease intake of.  3. At risk for foot problem At risk for foot problem due to history of gout.  4. Obesity with current BMI 31.6  Course: Brett Wilcox is currently in the action stage of change. As such, his goal is to continue with weight loss efforts.   Nutrition goals: He has agreed to the Category 3 Plan.   Exercise goals:  As is.  Behavioral modification strategies: meal planning and cooking strategies and better snacking choices.  [name] has agreed to follow-up with our clinic in 2-3 weeks. He was informed of the importance of frequent follow-up visits to maximize his success with intensive lifestyle modifications for his multiple health conditions.   Objective:   Blood pressure 127/81, pulse 68, temperature (!) 97.5 F (36.4 C), height 5\' 9"  (1.753 m), weight 213 lb (96.6 kg), SpO2 96 %. Body mass index is 31.45 kg/m.  General: Cooperative, alert, well developed, in no acute distress. HEENT: Conjunctivae and lids unremarkable. Cardiovascular: Regular rhythm.  Lungs: Normal work of breathing. Neurologic: No focal deficits.   Lab Results  Component Value Date   CREATININE 0.96 02/25/2021   BUN 17 02/25/2021   NA 138 02/25/2021  K 4.5 02/25/2021   CL 97 02/25/2021   CO2 25 02/25/2021   Lab Results  Component Value Date   ALT 44 02/25/2021   AST 29 02/25/2021   ALKPHOS 64 02/25/2021   BILITOT 0.6 02/25/2021   Lab Results  Component Value Date   HGBA1C 6.1 (H) 02/25/2021   Lab Results  Component Value Date   INSULIN 23.8 02/25/2021   Lab Results  Component Value Date   TSH 3.320 02/25/2021   Lab Results  Component Value Date   CHOL 155 02/25/2021   HDL 40 02/25/2021   LDLCALC 94 02/25/2021   TRIG 118  02/25/2021   CHOLHDL 3.9 02/25/2021   Lab Results  Component Value Date   VD25OH 25.7 (L) 02/25/2021   Lab Results  Component Value Date   WBC 6.6 02/25/2021   HGB 18.6 (H) 02/25/2021   HCT 55.9 (H) 02/25/2021   MCV 91 02/25/2021   PLT 268 02/25/2021   Attestation Statements:   Reviewed by clinician on day of visit: allergies, medications, problem list, medical history, surgical history, family history, social history, and previous encounter notes.  I, Insurance claims handler, CMA, am acting as Energy manager for [employer], DO.  I have reviewed the above documentation for accuracy and completeness, and I agree with the above. [name], D.O.  The 21st Century Cures Act was signed into law in 2016 which includes the topic of electronic health records.  This provides immediate access to information in MyChart.  This includes consultation notes, operative notes, office notes, lab results and pathology reports.  If you have any questions about what you read please let us know at your next visit so we can discuss your concerns and take corrective action if need be.  We are right here with you.

## 2021-04-21 ENCOUNTER — Ambulatory Visit (INDEPENDENT_AMBULATORY_CARE_PROVIDER_SITE_OTHER): Payer: BC Managed Care – PPO | Admitting: Family Medicine

## 2021-04-26 ENCOUNTER — Other Ambulatory Visit: Payer: Self-pay

## 2021-04-26 ENCOUNTER — Encounter (INDEPENDENT_AMBULATORY_CARE_PROVIDER_SITE_OTHER): Payer: Self-pay | Admitting: Family Medicine

## 2021-04-26 ENCOUNTER — Ambulatory Visit (INDEPENDENT_AMBULATORY_CARE_PROVIDER_SITE_OTHER): Payer: BC Managed Care – PPO | Admitting: Family Medicine

## 2021-04-26 VITALS — BP 132/76 | HR 82 | Temp 97.1°F | Ht 69.0 in | Wt 210.0 lb

## 2021-04-26 DIAGNOSIS — E669 Obesity, unspecified: Secondary | ICD-10-CM

## 2021-04-26 DIAGNOSIS — Z9189 Other specified personal risk factors, not elsewhere classified: Secondary | ICD-10-CM

## 2021-04-26 DIAGNOSIS — I10 Essential (primary) hypertension: Secondary | ICD-10-CM

## 2021-04-26 DIAGNOSIS — Z6833 Body mass index (BMI) 33.0-33.9, adult: Secondary | ICD-10-CM

## 2021-04-26 DIAGNOSIS — E559 Vitamin D deficiency, unspecified: Secondary | ICD-10-CM

## 2021-04-26 MED ORDER — VITAMIN D (ERGOCALCIFEROL) 1.25 MG (50000 UNIT) PO CAPS: 50000.0000 [IU] | ORAL_CAPSULE | ORAL | 0 refills | Status: DC

## 2021-04-30 NOTE — Progress Notes (Signed)
Chief Complaint:   OBESITY Brett Wilcox is here to discuss his progress with his obesity treatment plan along with follow-up of his obesity related diagnoses. Brett Wilcox is on the Category 3 Plan and states he is following his eating plan approximately 95% of the time. Brett Wilcox states he is walking 45 minutes 3 times per week and playing tennis for 90 minutes 3 times per week.  Today's visit was #: 4  Starting weight: 226 lbs Starting date: 02/25/2021 Today's weight: 210 lbs Today's date: 04/26/2021 Total lbs lost to date: 16 Total lbs lost since last in-office visit: 3  Subjective:   1. Essential hypertension Cardiovascular ROS: Brett Wilcox's blood pressure is at goal.  BP Readings from Last 3 Encounters:  04/26/21 132/76  04/01/21 127/81  03/11/21 140/82   Lab Results  Component Value Date   CREATININE 0.96 02/25/2021   CREATININE 0.90 11/17/2017   2. Vitamin D deficiency He is currently taking prescription vitamin D 50,000 IU each week. He denies nausea, vomiting or muscle weakness.  Lab Results  Component Value Date   VD25OH 25.7 (L) 02/25/2021   3. At risk for dehydration Brett Wilcox is at risk for dehydration.  Assessment/Plan:  No orders of the defined types were placed in this encounter.   Medications Discontinued During This Encounter  Medication Reason   Vitamin D, Ergocalciferol, (DRISDOL) 1.25 MG (50000 UNIT) CAPS capsule Reorder     Meds ordered this encounter  Medications   Vitamin D, Ergocalciferol, (DRISDOL) 1.25 MG (50000 UNIT) CAPS capsule    Sig: Take 1 capsule (50,000 Units total) by mouth every 7 (seven) days.    Dispense:  4 capsule    Refill:  0     1. Essential hypertension Brett Wilcox is working on healthy weight loss and exercise to improve blood pressure control. We will watch for signs of hypotension as he continues his lifestyle modifications.  2. Vitamin D deficiencyLow Vitamin D level contributes to fatigue and are associated with obesity, breast,  and colon cancer. He agrees to continue to take prescription Vitamin D @50 ,000 IU every week and will follow-up for routine testing of Vitamin D, at least 2-3 times per year to avoid over-replacement.  - Vitamin D, Ergocalciferol, (DRISDOL) 1.25 MG (50000 UNIT) CAPS capsule; Take 1 capsule (50,000 Units total) by mouth every 7 (seven) days.  Dispense: 4 capsule; Refill: 0  3. At risk for dehydration Brett Wilcox was given approximately 9 minutes dehydration prevention counseling today. Brett Wilcox is at risk for dehydration due to weight loss and current medication(s). He was encouraged to hydrate and monitor fluid status to avoid dehydration as well as weight loss plateaus.    4. Obesity with current BMI of 31.0 Brett Wilcox is currently in the action stage of change. As such, his goal is to continue with weight loss efforts. He has agreed to the Category 3 Plan with options at lunch to journal 500 to 600 calories and 45+ grams of protein.   Exercise goals:  As is  Behavioral modification strategies: increasing lean protein intake, decreasing simple carbohydrates, better snacking choices, and planning for success.  Brett Wilcox has agreed to follow-up with our clinic in 3 weeks. He was informed of the importance of frequent follow-up visits to maximize his success with intensive lifestyle modifications for his multiple health conditions.   Objective:   Blood pressure 132/76, pulse 82, temperature (!) 97.1 F (36.2 C), height 5\' 9"  (1.753 m), weight 210 lb (95.3 kg), SpO2 96 %. Body mass index is  31.01 kg/m.  General: Cooperative, alert, well developed, in no acute distress. HEENT: Conjunctivae and lids unremarkable. Cardiovascular: Regular rhythm.  Lungs: Normal work of breathing. Neurologic: No focal deficits.   Lab Results  Component Value Date   CREATININE 0.96 02/25/2021   BUN 17 02/25/2021   NA 138 02/25/2021   K 4.5 02/25/2021   CL 97 02/25/2021   CO2 25 02/25/2021   Lab Results  Component  Value Date   ALT 44 02/25/2021   AST 29 02/25/2021   ALKPHOS 64 02/25/2021   BILITOT 0.6 02/25/2021   Lab Results  Component Value Date   HGBA1C 6.1 (H) 02/25/2021   Lab Results  Component Value Date   INSULIN 23.8 02/25/2021   Lab Results  Component Value Date   TSH 3.320 02/25/2021   Lab Results  Component Value Date   CHOL 155 02/25/2021   HDL 40 02/25/2021   LDLCALC 94 02/25/2021   TRIG 118 02/25/2021   CHOLHDL 3.9 02/25/2021   Lab Results  Component Value Date   VD25OH 25.7 (L) 02/25/2021   Lab Results  Component Value Date   WBC 6.6 02/25/2021   HGB 18.6 (H) 02/25/2021   HCT 55.9 (H) 02/25/2021   MCV 91 02/25/2021   PLT 268 02/25/2021   No results found for: IRON, TIBC, FERRITIN  Attestation Statements:   Reviewed by clinician on day of visit: allergies, medications, problem list, medical history, surgical history, family history, social history, and previous encounter notes.  IKirke Corin, CMA, am acting as transcriptionist for [employer], DO  I have reviewed the above documentation for accuracy and completeness, and I agree with the above. [name], D.O.  The 21st Century Cures Act was signed into law in 2016 which includes the topic of electronic health records.  This provides immediate access to information in MyChart.  This includes consultation notes, operative notes, office notes, lab results and pathology reports.  If you have any questions about what you read please let us know at your next visit so we can discuss your concerns and take corrective action if need be.  We are right here with you.

## 2021-05-03 IMAGING — US US ABDOMEN LIMITED
1 series · 14 of 25 positions shown · non-contrast
Comparison: None.

CLINICAL DATA: Elevated liver enzymes

EXAM:
ULTRASOUND ABDOMEN LIMITED RIGHT UPPER QUADRANT

[Series 1: us abdomen limited · 0.23mm/px · 14 of 49 slices shown]
[im 1/49]
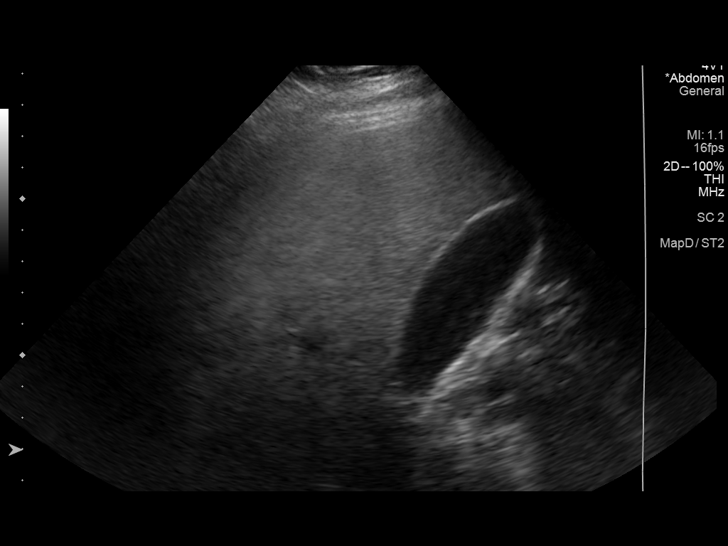
[im 5/49]
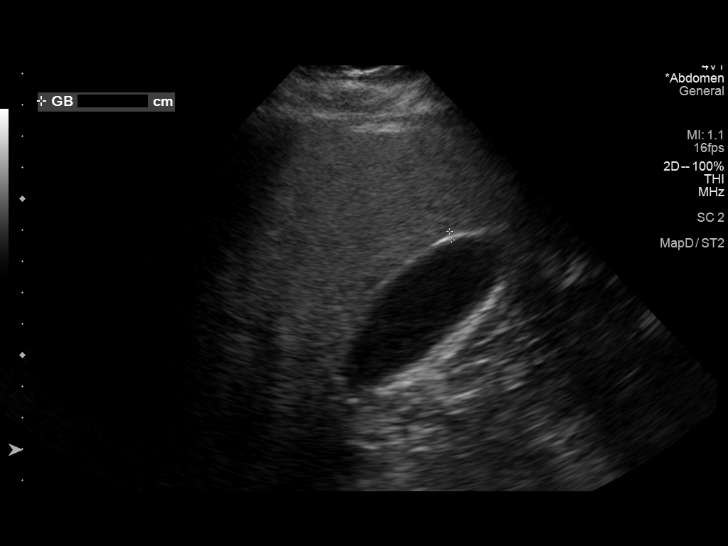
[im 9/49]
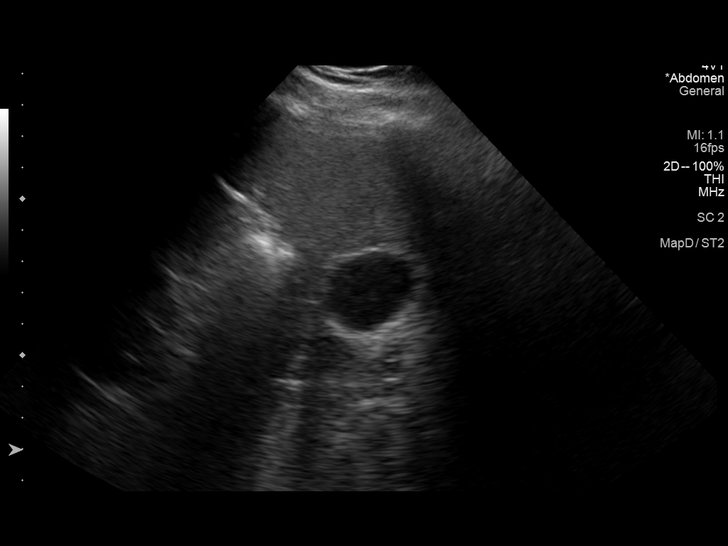
[im 13/49]
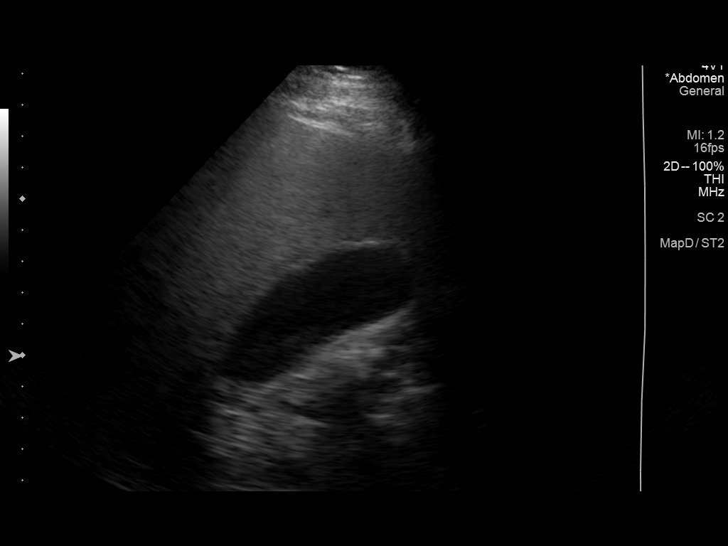
[im 17/49]
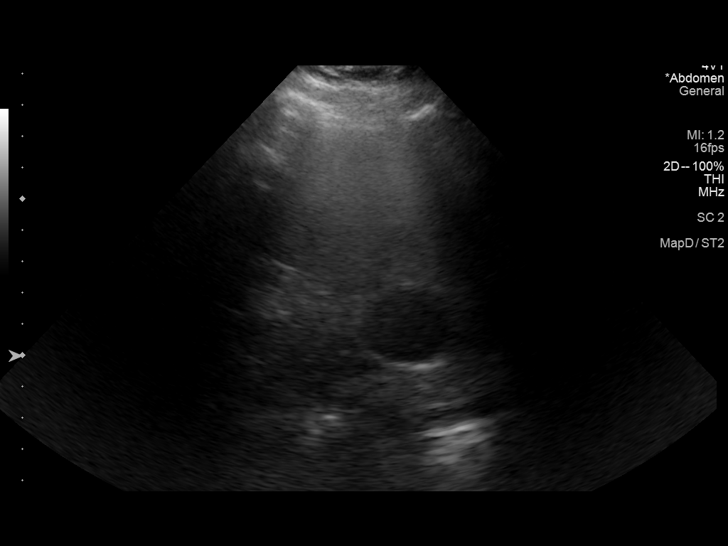
[im 19/49]
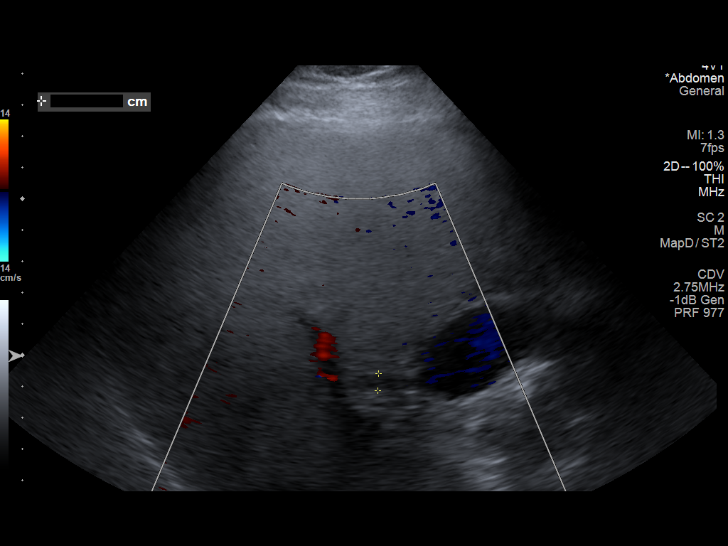
[im 23/49]
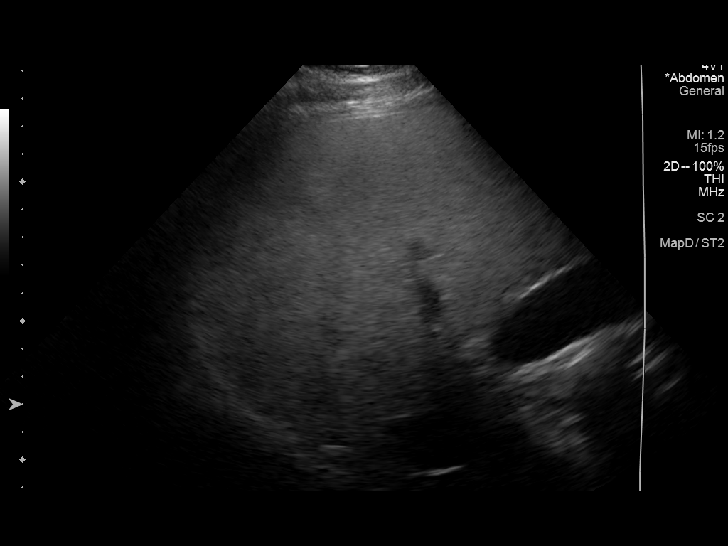
[im 27/49]
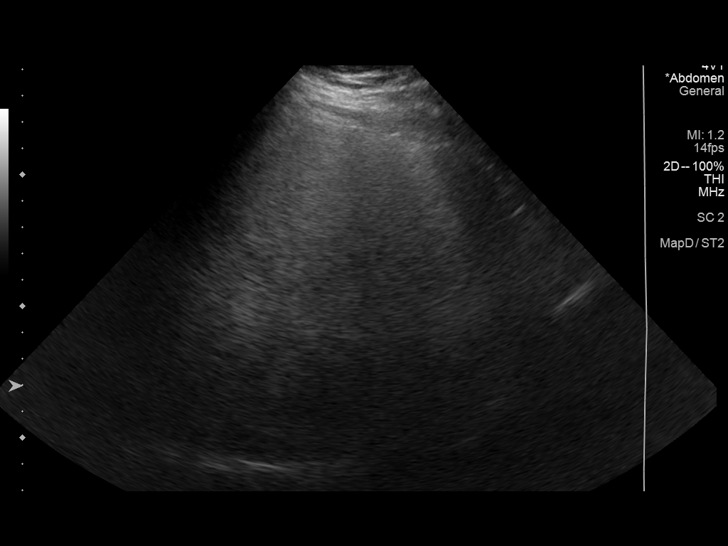
[im 31/49]
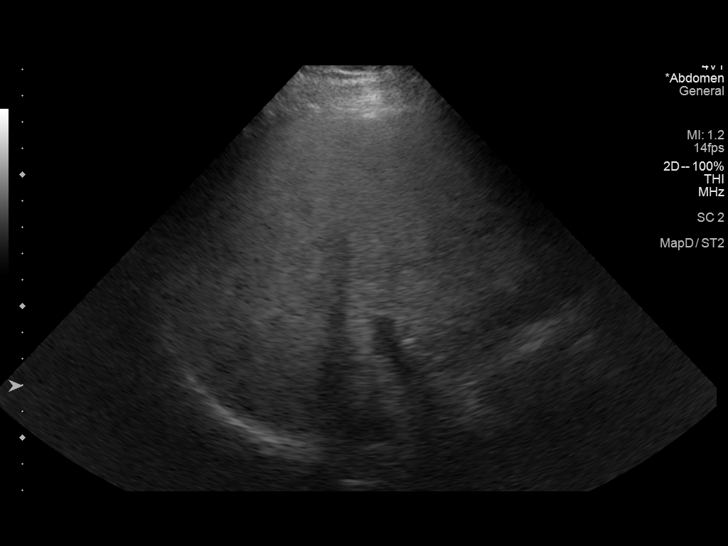
[im 33/49]
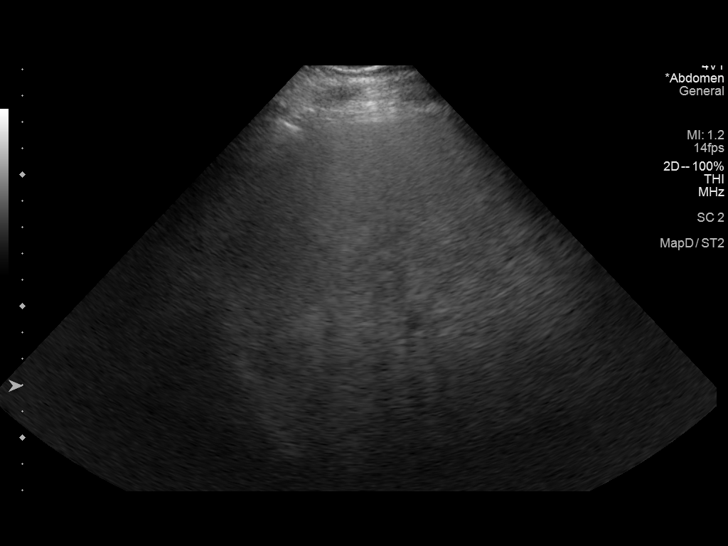
[im 37/49]
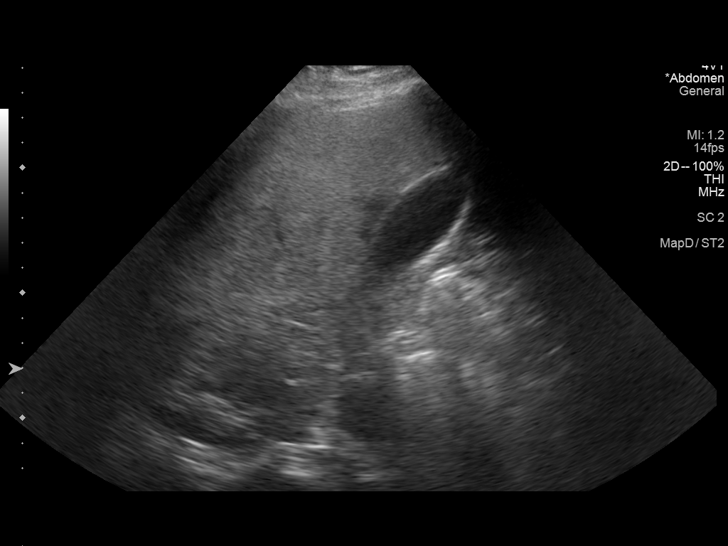
[im 41/49]
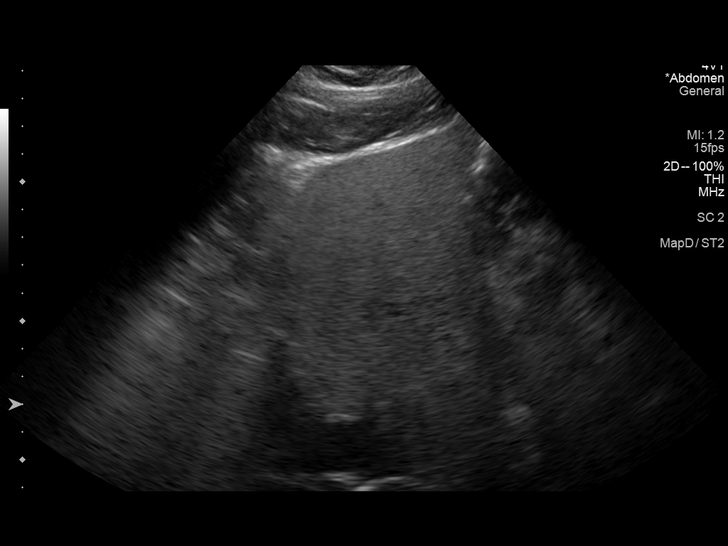
[im 45/49]
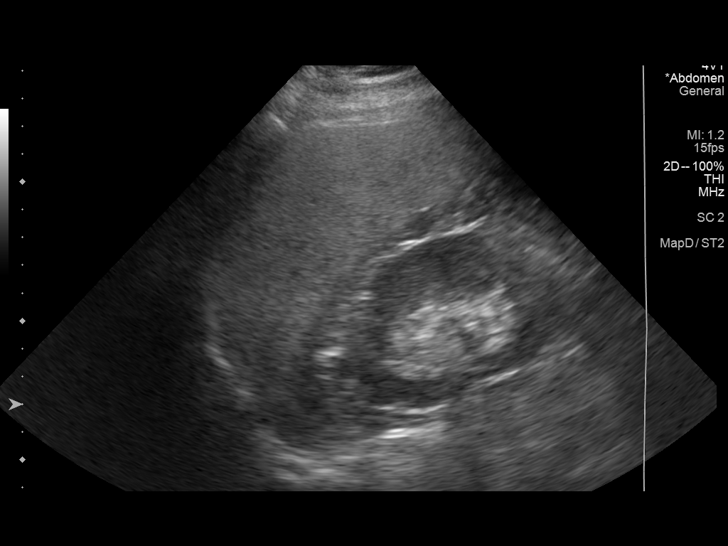
[im 49/49]
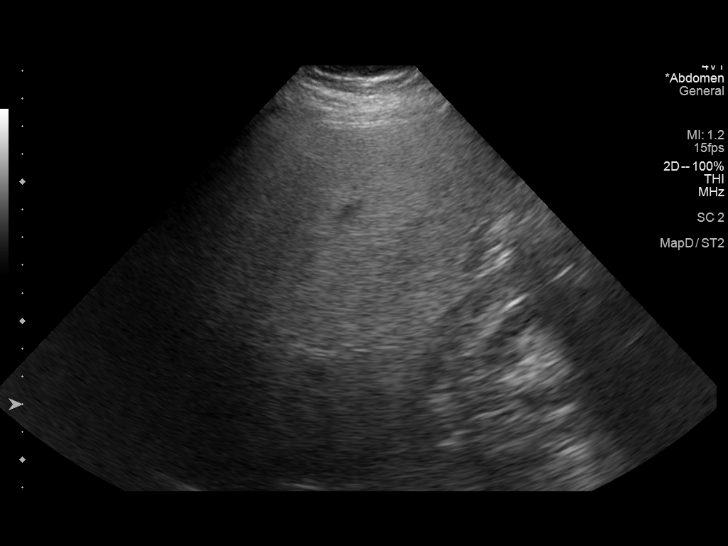

[14 of 25 positions shown; findings below may reference images not displayed]

FINDINGS: Gallbladder:

No gallstones or wall thickening visualized. There is no
pericholecystic fluid. No sonographic Murphy sign noted by
sonographer.

Common bile duct:

Diameter: 6 mm. No intrahepatic or extrahepatic biliary duct
dilatation

Liver:

No focal lesion identified. Liver echogenicity is diffusely
increased. Portal vein is patent on color Doppler imaging with
normal direction of blood flow towards the liver.

Other: None.
IMPRESSION: Diffuse increase in liver echogenicity, a finding indicative of
hepatic steatosis. While no focal liver lesions are evident, it must
be cautioned that the sensitivity of ultrasound for detection of
focal liver lesions is diminished in this circumstance.

Study otherwise unremarkable.

## 2021-05-18 ENCOUNTER — Encounter (INDEPENDENT_AMBULATORY_CARE_PROVIDER_SITE_OTHER): Payer: Self-pay | Admitting: Family Medicine

## 2021-05-18 ENCOUNTER — Other Ambulatory Visit: Payer: Self-pay

## 2021-05-18 ENCOUNTER — Ambulatory Visit (INDEPENDENT_AMBULATORY_CARE_PROVIDER_SITE_OTHER): Payer: BC Managed Care – PPO | Admitting: Family Medicine

## 2021-05-18 VITALS — BP 125/78 | HR 74 | Temp 97.7°F | Ht 69.0 in | Wt 206.0 lb

## 2021-05-18 DIAGNOSIS — Z6833 Body mass index (BMI) 33.0-33.9, adult: Secondary | ICD-10-CM

## 2021-05-18 DIAGNOSIS — E559 Vitamin D deficiency, unspecified: Secondary | ICD-10-CM

## 2021-05-18 DIAGNOSIS — R7303 Prediabetes: Secondary | ICD-10-CM | POA: Diagnosis not present

## 2021-05-18 DIAGNOSIS — Z9189 Other specified personal risk factors, not elsewhere classified: Secondary | ICD-10-CM | POA: Diagnosis not present

## 2021-05-18 DIAGNOSIS — E7849 Other hyperlipidemia: Secondary | ICD-10-CM | POA: Diagnosis not present

## 2021-05-18 DIAGNOSIS — D582 Other hemoglobinopathies: Secondary | ICD-10-CM

## 2021-05-18 DIAGNOSIS — E669 Obesity, unspecified: Secondary | ICD-10-CM

## 2021-05-18 MED ORDER — VITAMIN D (ERGOCALCIFEROL) 1.25 MG (50000 UNIT) PO CAPS: 50000.0000 [IU] | ORAL_CAPSULE | ORAL | 0 refills | Status: DC

## 2021-05-19 DIAGNOSIS — D485 Neoplasm of uncertain behavior of skin: Secondary | ICD-10-CM | POA: Diagnosis not present

## 2021-05-19 DIAGNOSIS — L821 Other seborrheic keratosis: Secondary | ICD-10-CM | POA: Diagnosis not present

## 2021-05-19 DIAGNOSIS — D225 Melanocytic nevi of trunk: Secondary | ICD-10-CM | POA: Diagnosis not present

## 2021-05-19 DIAGNOSIS — D0462 Carcinoma in situ of skin of left upper limb, including shoulder: Secondary | ICD-10-CM | POA: Diagnosis not present

## 2021-05-19 DIAGNOSIS — L814 Other melanin hyperpigmentation: Secondary | ICD-10-CM | POA: Diagnosis not present

## 2021-05-19 DIAGNOSIS — B078 Other viral warts: Secondary | ICD-10-CM | POA: Diagnosis not present

## 2021-05-19 NOTE — Progress Notes (Signed)
Chief Complaint:   OBESITY Brett Wilcox is here to discuss his progress with his obesity treatment plan along with follow-up of his obesity related diagnoses. Brett Wilcox is on the Category 3 Plan and keeping a food journal and adhering to recommended goals of 500-600 calories and 45 grams protein and states he is following his eating plan approximately 85% of the time. Brett Wilcox states he is walking and playing tennis 45-60 minutes 3 times per week.  Today's visit was #: 5 Starting weight: 226 lbs Starting date: 02/25/2021 Today's weight: 206 lbs Today's date: 05/18/2021 Total lbs lost to date: 20 Total lbs lost since last in-office visit: 4  Interim History: Brett Wilcox is here for a follow up office visit.  We reviewed his meal plan and questions were answered.  Patient's food recall appears to be accurate and consistent with what is on plan when he is following it.   When eating on plan, his hunger and cravings are well controlled.    Assessment/Plan:   Orders Placed This Encounter  Procedures   Insulin, random   Hemoglobin A1c   Lipid Panel With LDL/HDL Ratio   CBC with Differential/Platelet    Medications Discontinued During This Encounter  Medication Reason   Vitamin D, Ergocalciferol, (DRISDOL) 1.25 MG (50000 UNIT) CAPS capsule Reorder     Meds ordered this encounter  Medications   Vitamin D, Ergocalciferol, (DRISDOL) 1.25 MG (50000 UNIT) CAPS capsule    Sig: Take 1 capsule (50,000 Units total) by mouth every 7 (seven) days.    Dispense:  4 capsule    Refill:  0     1. Pre-diabetes Not at goal. Goal is HgbA1c < 5.7.  Medication: None. Brett Wilcox has engaged in lifestyle modifications over the last 3 months. She denies symptoms, carb cravings, or concerns.    Plan:  He will continue to focus on protein-rich, low simple carbohydrate foods. We reviewed the importance of hydration, regular exercise for stress reduction, and restorative sleep.  Recheck fasting insulin and A1c  at next OV. Continue weight loss via prudent nutritional plan and exercise.  Lab Results  Component Value Date   HGBA1C 6.1 (H) 02/25/2021   Lab Results  Component Value Date   INSULIN 23.8 02/25/2021   - Insulin, random - Hemoglobin A1c  2. Elevated hemoglobin (HCC) Elevated hemoglobin and hematocrit on prior labs. Pt reports no new symptoms or concerns.  Plan: recheck labs for stability at next OV. Likely due to testosterone treatments.  Lab Results  Component Value Date   WBC 6.6 02/25/2021   HGB 18.6 (H) 02/25/2021   HCT 55.9 (H) 02/25/2021   MCV 91 02/25/2021   PLT 268 02/25/2021   - CBC with Differential/Platelet  3. Vitamin D deficiency Not at goal.   Plan: Continue to take prescription Vitamin D @50 ,000 IU every week as prescribed.  Follow-up for routine testing of Vitamin D, at least 2-3 times per year to avoid over-replacement.  Lab Results  Component Value Date   VD25OH 25.7 (L) 02/25/2021   Refill- Vitamin D, Ergocalciferol, (DRISDOL) 1.25 MG (50000 UNIT) CAPS capsule; Take 1 capsule (50,000 Units total) by mouth every 7 (seven) days.  Dispense: 4 capsule; Refill: 0  4. Other hyperlipidemia Course: At goal. Lipid-lowering medications: Crestor.   Brett Wilcox is tolerating medication(s) well without side effects.  Medication compliance is good and patient appears to be taking it as prescribed.  Denies additional concerns regarding this condition.   Plan: Dietary changes:  Increase soluble fiber, decrease simple carbohydrates, decrease saturated fat. Exercise changes: Moderate to vigorous-intensity aerobic activity 150 minutes per week or as tolerated. We will continue to monitor along with PCP/specialists as it pertains to his weight loss journey. Recheck fasting lipid panel after 20 lb loss in fat mass.  Lab Results  Component Value Date   CHOL 155 02/25/2021   HDL 40 02/25/2021   LDLCALC 94 02/25/2021   TRIG 118 02/25/2021   CHOLHDL 3.9 02/25/2021    Lab Results  Component Value Date   ALT 44 02/25/2021   AST 29 02/25/2021   ALKPHOS 64 02/25/2021   BILITOT 0.6 02/25/2021   The 10-year ASCVD risk score Brett Wilcox DC Jr., et al., 2013) is: 9.3%   Values used to calculate the score:     Age: 61 years     Sex: Male     Is Non-Hispanic African American: No     Diabetic: No     Tobacco smoker: Yes     Systolic Blood Pressure: 125 mmHg     Is BP treated: Yes     HDL Cholesterol: 40 mg/dL     Total Cholesterol: 155 mg/dL  - Lipid Panel With LDL/HDL Ratio  5. At risk for impaired metabolic function Due to Brett Wilcox's current state of health and medical condition(s), he is at a significantly higher risk for impaired metabolic function.   At least 9 minutes was spent on counseling Wille about these concerns today.  This places the patient at a much greater risk to subsequently develop cardio-pulmonary conditions that can negatively affect the patient's quality of life.  I stressed the importance of reversing these risks factors.  The initial goal is to lose at least 5-10% of starting weight to help reduce risk factors.  Counseling:  Intensive lifestyle modifications discussed with Karlos as the most appropriate first line treatment.  he will continue to work on diet, exercise, and weight loss efforts.  We will continue to reassess these conditions on a fairly regular basis in an attempt to decrease the patient's overall morbidity and mortality.  6. Obesity with current BMI of 30.5  [name] is currently in the action stage of change. As such, his goal is to continue with weight loss efforts. He has agreed to the Category 3 Plan with options at lunch and keeping a food journal and adhering to recommended goals of 500-600 calories and 45 grams protein.   Exercise goals:  Increase to add strength training 2 days a week.  Behavioral modification strategies: increasing water intake and planning for success.  [name] has agreed to follow-up with our  clinic in 4 weeks- fasting. He was informed of the importance of frequent follow-up visits to maximize his success with intensive lifestyle modifications for his multiple health conditions.   Objective:   Blood pressure 125/78, pulse 74, temperature 97.7 F (36.5 C), height 5\' 9"  (1.753 m), weight 206 lb (93.4 kg), SpO2 96 %. Body mass index is 30.42 kg/m.  General: Cooperative, alert, well developed, in no acute distress. HEENT: Conjunctivae and lids unremarkable. Cardiovascular: Regular rhythm.  Lungs: Normal work of breathing. Neurologic: No focal deficits.   Lab Results  Component Value Date   CREATININE 0.96 02/25/2021   BUN 17 02/25/2021   NA 138 02/25/2021   K 4.5 02/25/2021   CL 97 02/25/2021   CO2 25 02/25/2021   Lab Results  Component Value Date   ALT 44 02/25/2021   AST 29 02/25/2021   ALKPHOS 64  02/25/2021   BILITOT 0.6 02/25/2021   Lab Results  Component Value Date   HGBA1C 6.1 (H) 02/25/2021   Lab Results  Component Value Date   INSULIN 23.8 02/25/2021   Lab Results  Component Value Date   TSH 3.320 02/25/2021   Lab Results  Component Value Date   CHOL 155 02/25/2021   HDL 40 02/25/2021   LDLCALC 94 02/25/2021   TRIG 118 02/25/2021   CHOLHDL 3.9 02/25/2021   Lab Results  Component Value Date   VD25OH 25.7 (L) 02/25/2021   Lab Results  Component Value Date   WBC 6.6 02/25/2021   HGB 18.6 (H) 02/25/2021   HCT 55.9 (H) 02/25/2021   MCV 91 02/25/2021   PLT 268 02/25/2021    Attestation Statements:   Reviewed by clinician on day of visit: allergies, medications, problem list, medical history, surgical history, family history, social history, and previous encounter notes.  Edmund Hilda, CMA, am acting as transcriptionist for [employer], DO.  I have reviewed the above documentation for accuracy and completeness, and I agree with the above. [name], D.O.  The 21st Century Cures Act was signed into law in 2016 which  includes the topic of electronic health records.  This provides immediate access to information in MyChart.  This includes consultation notes, operative notes, office notes, lab results and pathology reports.  If you have any questions about what you read please let us know at your next visit so we can discuss your concerns and take corrective action if need be.  We are right here with you.

## 2021-06-16 ENCOUNTER — Encounter (INDEPENDENT_AMBULATORY_CARE_PROVIDER_SITE_OTHER): Payer: Self-pay | Admitting: Family Medicine

## 2021-06-16 ENCOUNTER — Ambulatory Visit (INDEPENDENT_AMBULATORY_CARE_PROVIDER_SITE_OTHER): Payer: BC Managed Care – PPO | Admitting: Family Medicine

## 2021-06-16 ENCOUNTER — Other Ambulatory Visit: Payer: Self-pay

## 2021-06-16 VITALS — BP 122/81 | HR 66 | Temp 97.6°F | Ht 69.0 in | Wt 200.0 lb

## 2021-06-16 DIAGNOSIS — Z6833 Body mass index (BMI) 33.0-33.9, adult: Secondary | ICD-10-CM | POA: Diagnosis not present

## 2021-06-16 DIAGNOSIS — E559 Vitamin D deficiency, unspecified: Secondary | ICD-10-CM | POA: Diagnosis not present

## 2021-06-16 DIAGNOSIS — E669 Obesity, unspecified: Secondary | ICD-10-CM | POA: Diagnosis not present

## 2021-06-16 DIAGNOSIS — D582 Other hemoglobinopathies: Secondary | ICD-10-CM | POA: Diagnosis not present

## 2021-06-16 DIAGNOSIS — R7303 Prediabetes: Secondary | ICD-10-CM | POA: Diagnosis not present

## 2021-06-16 DIAGNOSIS — E7849 Other hyperlipidemia: Secondary | ICD-10-CM | POA: Diagnosis not present

## 2021-06-16 MED ORDER — VITAMIN D (ERGOCALCIFEROL) 1.25 MG (50000 UNIT) PO CAPS: 50000.0000 [IU] | ORAL_CAPSULE | ORAL | 0 refills | Status: DC

## 2021-06-17 LAB — INSULIN, RANDOM: INSULIN: 12.2 u[IU]/mL (ref 2.6–24.9)

## 2021-06-17 LAB — CBC WITH DIFFERENTIAL/PLATELET
Basophils Absolute: 0.1 10*3/uL (ref 0.0–0.2)
Basos: 1 %
EOS (ABSOLUTE): 0.1 10*3/uL (ref 0.0–0.4)
Eos: 2 %
Hematocrit: 58.7 % — ABNORMAL HIGH (ref 37.5–51.0)
Hemoglobin: 19.4 g/dL — ABNORMAL HIGH (ref 13.0–17.7)
Immature Grans (Abs): 0 10*3/uL (ref 0.0–0.1)
Immature Granulocytes: 0 %
Lymphocytes Absolute: 1.5 10*3/uL (ref 0.7–3.1)
Lymphs: 25 %
MCH: 29.6 pg (ref 26.6–33.0)
MCHC: 33 g/dL (ref 31.5–35.7)
MCV: 90 fL (ref 79–97)
Monocytes Absolute: 0.6 10*3/uL (ref 0.1–0.9)
Monocytes: 9 %
Neutrophils Absolute: 3.7 10*3/uL (ref 1.4–7.0)
Neutrophils: 63 %
Platelets: 222 10*3/uL (ref 150–450)
RBC: 6.56 x10E6/uL — ABNORMAL HIGH (ref 4.14–5.80)
RDW: 14.7 % (ref 11.6–15.4)
WBC: 6 10*3/uL (ref 3.4–10.8)

## 2021-06-17 LAB — LIPID PANEL WITH LDL/HDL RATIO
Cholesterol, Total: 139 mg/dL (ref 100–199)
HDL: 41 mg/dL (ref >39)
LDL Chol Calc (NIH): 80 mg/dL (ref 0–99)
LDL/HDL Ratio: 2 ratio (ref 0.0–3.6)
Triglycerides: 96 mg/dL (ref 0–149)
VLDL Cholesterol Cal: 18 mg/dL (ref 5–40)

## 2021-06-17 LAB — HEMOGLOBIN A1C
Est. average glucose Bld gHb Est-mCnc: 111 mg/dL
Hgb A1c MFr Bld: 5.5 % (ref 4.8–5.6)

## 2021-06-17 LAB — VITAMIN D 25 HYDROXY (VIT D DEFICIENCY, FRACTURES): Vit D, 25-Hydroxy: 77.3 ng/mL (ref 30.0–100.0)

## 2021-06-17 NOTE — Progress Notes (Signed)
Chief Complaint:   OBESITY Brett Wilcox is here to discuss his progress with his obesity treatment plan along with follow-up of his obesity related diagnoses. Brett Wilcox is on the Category 3 Plan and states he is following his eating plan approximately 85% of the time. Brett Wilcox states he is walking, playing tennis, sit-ups, and push-ups 40-90 minutes 1-5 times per week.  Today's visit was #: 6 Starting weight: 226 lbs Starting date: 02/25/2021 Today's weight: 200 lbs Today's date: 06/16/2021 Total lbs lost to date: 26 Total lbs lost since last in-office visit: 6  Interim History: Brett Wilcox is here for a follow up office visit.  We reviewed his meal plan and questions were answered.  Patient's food recall appears to be accurate and consistent with what is on plan when he is following it.   When eating on plan, his hunger and cravings are well controlled.    Subjective:   1. Vitamin D deficiency He is currently taking prescription vitamin D 50,000 IU each week. He denies nausea, vomiting or muscle weakness.  Lab Results  Component Value Date   VD25OH 77.3 06/16/2021   VD25OH 25.7 (L) 02/25/2021   Assessment/Plan:   Orders Placed This Encounter  Procedures   VITAMIN D 25 Hydroxy (Vit-D Deficiency, Fractures)    Medications Discontinued During This Encounter  Medication Reason   Vitamin D, Ergocalciferol, (DRISDOL) 1.25 MG (50000 UNIT) CAPS capsule Reorder     Meds ordered this encounter  Medications   Vitamin D, Ergocalciferol, (DRISDOL) 1.25 MG (50000 UNIT) CAPS capsule    Sig: Take 1 capsule (50,000 Units total) by mouth every 7 (seven) days.    Dispense:  4 capsule    Refill:  0     1. Vitamin D deficiency Low Vitamin D level contributes to fatigue and are associated with obesity, breast, and colon cancer. He agrees to continue to take prescription Vitamin D 50,000 IU every week and will follow-up for routine testing of Vitamin D, at least 2-3 times per year to avoid  over-replacement.  Refill- Vitamin D, Ergocalciferol, (DRISDOL) 1.25 MG (50000 UNIT) CAPS capsule; Take 1 capsule (50,000 Units total) by mouth every 7 (seven) days.  Dispense: 4 capsule; Refill: 0  - VITAMIN D 25 Hydroxy (Vit-D Deficiency, Fractures)  2. Obesity with current BMI of 29.5  Brett Wilcox is currently in the action stage of change. As such, his goal is to continue with weight loss efforts. He has agreed to the Category 3 Plan.   Exercise goals:  As is- Add strength training.  Behavioral modification strategies: increasing lean protein intake, decreasing simple carbohydrates, meal planning and cooking strategies, and planning for success.  Brett Wilcox has agreed to follow-up with our clinic in 3 weeks. He was informed of the importance of frequent follow-up visits to maximize his success with intensive lifestyle modifications for his multiple health conditions.   Brett Wilcox was informed we would discuss his lab results at his next visit unless there is a critical issue that needs to be addressed sooner. Brett Wilcox agreed to keep his next visit at the agreed upon time to discuss these results.  Objective:   Blood pressure 122/81, pulse 66, temperature 97.6 F (36.4 C), height 5\' 9"  (1.753 m), weight 200 lb (90.7 kg), SpO2 98 %. Body mass index is 29.53 kg/m.  General: Cooperative, alert, well developed, in no acute distress. HEENT: Conjunctivae and lids unremarkable. Cardiovascular: Regular rhythm.  Lungs: Normal work of breathing. Neurologic: No focal deficits.   Lab  Results  Component Value Date   CREATININE 0.96 02/25/2021   BUN 17 02/25/2021   NA 138 02/25/2021   K 4.5 02/25/2021   CL 97 02/25/2021   CO2 25 02/25/2021   Lab Results  Component Value Date   ALT 44 02/25/2021   AST 29 02/25/2021   ALKPHOS 64 02/25/2021   BILITOT 0.6 02/25/2021   Lab Results  Component Value Date   HGBA1C 5.5 06/16/2021   HGBA1C 6.1 (H) 02/25/2021   Lab Results  Component Value Date    INSULIN 12.2 06/16/2021   INSULIN 23.8 02/25/2021   Lab Results  Component Value Date   TSH 3.320 02/25/2021   Lab Results  Component Value Date   CHOL 139 06/16/2021   HDL 41 06/16/2021   LDLCALC 80 06/16/2021   TRIG 96 06/16/2021   CHOLHDL 3.9 02/25/2021   Lab Results  Component Value Date   VD25OH 77.3 06/16/2021   VD25OH 25.7 (L) 02/25/2021   Lab Results  Component Value Date   WBC 6.0 06/16/2021   HGB 19.4 (H) 06/16/2021   HCT 58.7 (H) 06/16/2021   MCV 90 06/16/2021   PLT 222 06/16/2021    Attestation Statements:   Reviewed by clinician on day of visit: allergies, medications, problem list, medical history, surgical history, family history, social history, and previous encounter notes.  Edmund Hilda, CMA, am acting as transcriptionist for [employer], DO.  I have reviewed the above documentation for accuracy and completeness, and I agree with the above. [name], D.O.  The 21st Century Cures Act was signed into law in 2016 which includes the topic of electronic health records.  This provides immediate access to information in MyChart.  This includes consultation notes, operative notes, office notes, lab results and pathology reports.  If you have any questions about what you read please let us know at your next visit so we can discuss your concerns and take corrective action if need be.  We are right here with you.

## 2021-06-28 DIAGNOSIS — Z421 Encounter for breast reconstruction following mastectomy: Secondary | ICD-10-CM | POA: Diagnosis not present

## 2021-06-29 DIAGNOSIS — Z421 Encounter for breast reconstruction following mastectomy: Secondary | ICD-10-CM | POA: Diagnosis not present

## 2021-07-05 DIAGNOSIS — Z5181 Encounter for therapeutic drug level monitoring: Secondary | ICD-10-CM | POA: Diagnosis not present

## 2021-07-05 DIAGNOSIS — E291 Testicular hypofunction: Secondary | ICD-10-CM | POA: Diagnosis not present

## 2021-07-06 ENCOUNTER — Ambulatory Visit (INDEPENDENT_AMBULATORY_CARE_PROVIDER_SITE_OTHER): Payer: BC Managed Care – PPO | Admitting: Family Medicine

## 2021-07-06 ENCOUNTER — Encounter (INDEPENDENT_AMBULATORY_CARE_PROVIDER_SITE_OTHER): Payer: Self-pay | Admitting: Family Medicine

## 2021-07-06 ENCOUNTER — Other Ambulatory Visit: Payer: Self-pay

## 2021-07-06 VITALS — BP 126/80 | HR 68 | Temp 98.0°F | Ht 69.0 in | Wt 199.0 lb

## 2021-07-06 DIAGNOSIS — I10 Essential (primary) hypertension: Secondary | ICD-10-CM | POA: Diagnosis not present

## 2021-07-06 DIAGNOSIS — E7849 Other hyperlipidemia: Secondary | ICD-10-CM | POA: Diagnosis not present

## 2021-07-06 DIAGNOSIS — Z6833 Body mass index (BMI) 33.0-33.9, adult: Secondary | ICD-10-CM

## 2021-07-06 DIAGNOSIS — E66811 Obesity, class 1: Secondary | ICD-10-CM

## 2021-07-06 DIAGNOSIS — Z9189 Other specified personal risk factors, not elsewhere classified: Secondary | ICD-10-CM | POA: Diagnosis not present

## 2021-07-06 DIAGNOSIS — E559 Vitamin D deficiency, unspecified: Secondary | ICD-10-CM | POA: Diagnosis not present

## 2021-07-06 DIAGNOSIS — E669 Obesity, unspecified: Secondary | ICD-10-CM

## 2021-07-06 DIAGNOSIS — R7303 Prediabetes: Secondary | ICD-10-CM | POA: Diagnosis not present

## 2021-07-06 DIAGNOSIS — D582 Other hemoglobinopathies: Secondary | ICD-10-CM

## 2021-07-06 MED ORDER — VITAMIN D (ERGOCALCIFEROL) 1.25 MG (50000 UNIT) PO CAPS: 50000.0000 [IU] | ORAL_CAPSULE | ORAL | 0 refills | Status: DC

## 2021-07-07 ENCOUNTER — Ambulatory Visit (INDEPENDENT_AMBULATORY_CARE_PROVIDER_SITE_OTHER): Payer: BC Managed Care – PPO | Admitting: Family Medicine

## 2021-07-07 NOTE — Progress Notes (Signed)
Chief Complaint:   OBESITY Brett Wilcox is here to discuss his progress with his obesity treatment plan along with follow-up of his obesity related diagnoses. Brett Wilcox is on the Category 3 Plan and states he is following his eating plan approximately 85% of the time. Brett Wilcox states he is walking, playing tennis, doing sit-ups, and push-ups 45 minutes 3-5 times per week.  Today's visit was #: 7 Starting weight: 226 lbs Starting date: 02/25/2021 Today's weight: 199 lbs Today's date: 07/06/2021 Total lbs lost to date: 27 Total lbs lost since last in-office visit: 1  Interim History: Brett Wilcox is here for a follow up office visit.  We reviewed his meal plan and questions were answered.  Patient's food recall appears to be accurate and consistent with what is on plan when he is following it.   When eating on plan, his hunger and cravings are well controlled.   Brett Wilcox's wife had breast reconstruction surgery and came home from the hospital last Friday. Pt has not been eating as well as usual without his wife's cooking.  Subjective:   1. Pre-diabetes Discussed labs with patient today. Diet controlled. Brett Wilcox's A1c and fasting insulin have decreased from prior and are now within normal limits.  2. Other hyperlipidemia Discussed labs with patient today. Increasing cholesterol panel. Brett Wilcox has hyperlipidemia and has been trying to improve his cholesterol levels with intensive lifestyle modification including a low saturated fat diet, exercise and weight loss. He denies any chest pain, claudication or myalgias.  Lab Results  Component Value Date   ALT 44 02/25/2021   AST 29 02/25/2021   ALKPHOS 64 02/25/2021   BILITOT 0.6 02/25/2021   Lab Results  Component Value Date   CHOL 139 06/16/2021   HDL 41 06/16/2021   LDLCALC 80 06/16/2021   TRIG 96 06/16/2021   CHOLHDL 3.9 02/25/2021   3. Essential hypertension At goal. Medication: HCTZ  BP Readings from Last 3 Encounters:  07/06/21  126/80  06/16/21 122/81  05/18/21 125/78   Lab Results  Component Value Date   CREATININE 0.96 02/25/2021   CREATININE 0.90 11/17/2017   4. Vitamin D deficiency He is currently taking prescription vitamin D 50,000 IU each week. He denies nausea, vomiting or muscle weakness.  Lab Results  Component Value Date   VD25OH 77.3 06/16/2021   VD25OH 25.7 (L) 02/25/2021   5. Elevated hemoglobin (HCC) Pt's Hgb/Hct are increasing. He has been on testosterone treatment, per PCP, once a year. This could be the cause and effect.  6. At risk for impaired metabolic function Brett Wilcox is at increased risk for impaired metabolic function due to pre-diabetes and hypothyroidism.  Assessment/Plan:  No orders of the defined types were placed in this encounter.   Medications Discontinued During This Encounter  Medication Reason   Vitamin D, Ergocalciferol, (DRISDOL) 1.25 MG (50000 UNIT) CAPS capsule Reorder     Meds ordered this encounter  Medications   Vitamin D, Ergocalciferol, (DRISDOL) 1.25 MG (50000 UNIT) CAPS capsule    Sig: Take 1 capsule (50,000 Units total) by mouth every 7 (seven) days.    Dispense:  4 capsule    Refill:  0     1. Pre-diabetes Brett Wilcox will continue to work on weight loss, exercise, and decreasing simple carbohydrates to help decrease the risk of diabetes. Labs are within normal limits now and weight loss and prudent nutritional plan. Continue current treatment plan.  2. Other hyperlipidemia Cardiovascular risk and specific lipid/LDL goals reviewed.  We discussed  several lifestyle modifications today and Brett Wilcox will continue to work on diet, exercise and weight loss efforts. Orders and follow up as documented in patient record. Continue Crestor and increase exercise to increase HDL.  Counseling Intensive lifestyle modifications are the first line treatment for this issue. Dietary changes: Increase soluble fiber. Decrease simple carbohydrates. Exercise changes: Moderate  to vigorous-intensity aerobic activity 150 minutes per week if tolerated. Lipid-lowering medications: see documented in medical record.  3. Essential hypertension Brett Wilcox is working on healthy weight loss and exercise to improve blood pressure control. We will watch for signs of hypotension as he continues his lifestyle modifications. Continue HCTZ.  4. Vitamin D deficiency Low Vitamin D level contributes to fatigue and are associated with obesity, breast, and colon cancer. He agrees to continue to take prescription Vitamin D 50,000 IU every week and will follow-up for routine testing of Vitamin D, at least 2-3 times per year to avoid over-replacement.  Refill- Vitamin D, Ergocalciferol, (DRISDOL) 1.25 MG (50000 UNIT) CAPS capsule; Take 1 capsule (50,000 Units total) by mouth every 7 (seven) days.  Dispense: 4 capsule; Refill: 0  5. Elevated hemoglobin (HCC) Pt advised to discuss abnormal lab with PCP and ask for next best steps. Pt says he also had recent labs with PCP and I recommend he make follow up with them to discuss labs.  6. At risk for impaired metabolic function Brett Wilcox was given approximately 15 minutes of impaired  metabolic function prevention counseling today. We discussed intensive lifestyle modifications today with an emphasis on specific nutrition and exercise instructions and strategies.   Repetitive spaced learning was employed today to elicit superior memory formation and behavioral change.   7. Obesity with current BMI of 29.4  Brett Wilcox is currently in the action stage of change. As such, his goal is to continue with weight loss efforts. He has agreed to the Category 3 Plan.   Pt has questions about high protein, low carb crackers. Ideas discussed with pt.  Exercise goals:  As is  Behavioral modification strategies: keeping healthy foods in the home and planning for success.  Brett Wilcox has agreed to follow-up with our clinic in 3-4 weeks. He was informed of the importance  of frequent follow-up visits to maximize his success with intensive lifestyle modifications for his multiple health conditions.   Objective:   Blood pressure 126/80, pulse 68, temperature 98 F (36.7 C), height 5\' 9"  (1.753 m), weight 199 lb (90.3 kg), SpO2 94 %. Body mass index is 29.39 kg/m.  General: Cooperative, alert, well developed, in no acute distress. HEENT: Conjunctivae and lids unremarkable. Cardiovascular: Regular rhythm.  Lungs: Normal work of breathing. Neurologic: No focal deficits.   Lab Results  Component Value Date   CREATININE 0.96 02/25/2021   BUN 17 02/25/2021   NA 138 02/25/2021   K 4.5 02/25/2021   CL 97 02/25/2021   CO2 25 02/25/2021   Lab Results  Component Value Date   ALT 44 02/25/2021   AST 29 02/25/2021   ALKPHOS 64 02/25/2021   BILITOT 0.6 02/25/2021   Lab Results  Component Value Date   HGBA1C 5.5 06/16/2021   HGBA1C 6.1 (H) 02/25/2021   Lab Results  Component Value Date   INSULIN 12.2 06/16/2021   INSULIN 23.8 02/25/2021   Lab Results  Component Value Date   TSH 3.320 02/25/2021   Lab Results  Component Value Date   CHOL 139 06/16/2021   HDL 41 06/16/2021   LDLCALC 80 06/16/2021   TRIG 96  06/16/2021   CHOLHDL 3.9 02/25/2021   Lab Results  Component Value Date   VD25OH 77.3 06/16/2021   VD25OH 25.7 (L) 02/25/2021   Lab Results  Component Value Date   WBC 6.0 06/16/2021   HGB 19.4 (H) 06/16/2021   HCT 58.7 (H) 06/16/2021   MCV 90 06/16/2021   PLT 222 06/16/2021    Attestation Statements:   Reviewed by clinician on day of visit: allergies, medications, problem list, medical history, surgical history, family history, social history, and previous encounter notes.  Edmund Hilda, CMA, am acting as transcriptionist for [employer], DO.  I have reviewed the above documentation for accuracy and completeness, and I agree with the above. [name], D.O.  The 21st Century Cures Act was signed into law  in 2016 which includes the topic of electronic health records.  This provides immediate access to information in MyChart.  This includes consultation notes, operative notes, office notes, lab results and pathology reports.  If you have any questions about what you read please let us know at your next visit so we can discuss your concerns and take corrective action if need be.  We are right here with you.

## 2021-07-17 ENCOUNTER — Other Ambulatory Visit (INDEPENDENT_AMBULATORY_CARE_PROVIDER_SITE_OTHER): Payer: Self-pay | Admitting: Family Medicine

## 2021-07-17 DIAGNOSIS — E559 Vitamin D deficiency, unspecified: Secondary | ICD-10-CM

## 2021-07-19 NOTE — Telephone Encounter (Signed)
Last seen by Dr. Opalski. 

## 2021-07-26 ENCOUNTER — Encounter (INDEPENDENT_AMBULATORY_CARE_PROVIDER_SITE_OTHER): Payer: Self-pay

## 2021-07-27 ENCOUNTER — Other Ambulatory Visit: Payer: Self-pay

## 2021-07-27 ENCOUNTER — Ambulatory Visit (INDEPENDENT_AMBULATORY_CARE_PROVIDER_SITE_OTHER): Payer: BC Managed Care – PPO | Admitting: Adult Health

## 2021-07-27 ENCOUNTER — Encounter (INDEPENDENT_AMBULATORY_CARE_PROVIDER_SITE_OTHER): Payer: Self-pay | Admitting: Adult Health

## 2021-07-27 VITALS — BP 127/73 | HR 63 | Temp 97.8°F | Ht 69.0 in | Wt 197.0 lb

## 2021-07-27 DIAGNOSIS — E559 Vitamin D deficiency, unspecified: Secondary | ICD-10-CM | POA: Diagnosis not present

## 2021-07-27 DIAGNOSIS — D582 Other hemoglobinopathies: Secondary | ICD-10-CM | POA: Diagnosis not present

## 2021-07-27 DIAGNOSIS — E669 Obesity, unspecified: Secondary | ICD-10-CM | POA: Diagnosis not present

## 2021-07-27 DIAGNOSIS — Z6833 Body mass index (BMI) 33.0-33.9, adult: Secondary | ICD-10-CM | POA: Diagnosis not present

## 2021-07-27 NOTE — Progress Notes (Signed)
Chief Complaint:   OBESITY Brett Wilcox is here to discuss his progress with his obesity treatment plan along with follow-up of his obesity related diagnoses. Brett Wilcox is on the Category 3 Plan and states he is following his eating plan approximately 90% of the time. Brett Wilcox states he is walking/playing tennis 45-90 minutes 3-4 times per week.  Today's visit was #: 8 Starting weight: 226 lbs Starting date: 02/25/2021 Today's weight: 197 lbs Today's date: 07/27/2021 Total lbs lost to date: 29 lbs Total lbs lost since last in-office visit: 2 lbs  Interim History: Brett Wilcox continues to enjoy the foods/structure of the Category 3 meal plan.   He is consistently tracking snack calories - keeping <300 calories per day. Discussed 10:1 ratio - calories/protein.  Subjective:   1. Vitamin D deficiency On 06/16/2021, vitamin D level - 77.3 - at goal. He is currently taking prescription ergocalciferol 50,000 IU each week. He denies nausea, vomiting or muscle weakness.  2. Elevated hemoglobin (HCC) Last 2 CMP panels demonstrated elevated H/H. He is on testosterone 1.62% gel/ 2 pumps every morning - PCP decreased testosterone from 2 pumps to 1 pump.   He denies tobacco/vape use.  He denies family history of MI/CVA.  Assessment/Plan:   1. Vitamin D deficiency Stop ergocalciferol. Start OTC vitamin D3 2,000 IU daily.  2. Elevated hemoglobin (HCC) Follow-up PCP in November for lab recheck.  3. Obesity with current BMI of 29.2  Brett Wilcox is currently in the action stage of change. As such, his goal is to continue with weight loss efforts. He has agreed to the Category 3 Plan.   Handout:  Protein Content of Foods.  Exercise goals:  As is.  Behavioral modification strategies: increasing lean protein intake, decreasing simple carbohydrates, meal planning and cooking strategies, keeping healthy foods in the home, and planning for success.  Brett Wilcox has agreed to follow-up with our clinic in 2-3 weeks.  He was informed of the importance of frequent follow-up visits to maximize his success with intensive lifestyle modifications for his multiple health conditions.   Objective:   Blood pressure 127/73, pulse 63, temperature 97.8 F (36.6 C), height 5\' 9"  (1.753 m), weight 197 lb (89.4 kg), SpO2 97 %. Body mass index is 29.09 kg/m.  General: Cooperative, alert, well developed, in no acute distress. HEENT: Conjunctivae and lids unremarkable. Cardiovascular: Regular rhythm.  Lungs: Normal work of breathing. Neurologic: No focal deficits.   Lab Results  Component Value Date   CREATININE 0.96 02/25/2021   BUN 17 02/25/2021   NA 138 02/25/2021   K 4.5 02/25/2021   CL 97 02/25/2021   CO2 25 02/25/2021   Lab Results  Component Value Date   ALT 44 02/25/2021   AST 29 02/25/2021   ALKPHOS 64 02/25/2021   BILITOT 0.6 02/25/2021   Lab Results  Component Value Date   HGBA1C 5.5 06/16/2021   HGBA1C 6.1 (H) 02/25/2021   Lab Results  Component Value Date   INSULIN 12.2 06/16/2021   INSULIN 23.8 02/25/2021   Lab Results  Component Value Date   TSH 3.320 02/25/2021   Lab Results  Component Value Date   CHOL 139 06/16/2021   HDL 41 06/16/2021   LDLCALC 80 06/16/2021   TRIG 96 06/16/2021   CHOLHDL 3.9 02/25/2021   Lab Results  Component Value Date   VD25OH 77.3 06/16/2021   VD25OH 25.7 (L) 02/25/2021   Lab Results  Component Value Date   WBC 6.0 06/16/2021   HGB 19.4 (H)  06/16/2021   HCT 58.7 (H) 06/16/2021   MCV 90 06/16/2021   PLT 222 06/16/2021   Attestation Statements:   Reviewed by clinician on day of visit: allergies, medications, problem list, medical history, surgical history, family history, social history, and previous encounter notes.  Time spent on visit including pre-visit chart review and post-visit care and charting was 28 minutes.   I, Insurance claims handler, CMA, am acting as Energy manager for William Hamburger, NP.  I have reviewed the above documentation for  accuracy and completeness, and I agree with the above. -  Lonny Eisen d. Hisako Bugh, NP-C

## 2021-08-10 ENCOUNTER — Ambulatory Visit (INDEPENDENT_AMBULATORY_CARE_PROVIDER_SITE_OTHER): Payer: BC Managed Care – PPO | Admitting: Family Medicine

## 2021-08-10 ENCOUNTER — Encounter (INDEPENDENT_AMBULATORY_CARE_PROVIDER_SITE_OTHER): Payer: Self-pay | Admitting: Family Medicine

## 2021-08-10 ENCOUNTER — Other Ambulatory Visit: Payer: Self-pay

## 2021-08-10 VITALS — BP 124/79 | HR 61 | Temp 97.9°F | Ht 69.0 in | Wt 194.0 lb

## 2021-08-10 DIAGNOSIS — E669 Obesity, unspecified: Secondary | ICD-10-CM | POA: Diagnosis not present

## 2021-08-10 DIAGNOSIS — I10 Essential (primary) hypertension: Secondary | ICD-10-CM | POA: Diagnosis not present

## 2021-08-10 DIAGNOSIS — Z6833 Body mass index (BMI) 33.0-33.9, adult: Secondary | ICD-10-CM | POA: Diagnosis not present

## 2021-08-10 NOTE — Progress Notes (Signed)
Chief Complaint:   OBESITY Brett Wilcox is here to discuss his progress with his obesity treatment plan along with follow-up of his obesity related diagnoses. Brett Wilcox is on the Category 3 Plan and states he is following his eating plan approximately 85% of the time. Brett Wilcox states he is walking, playing Tennis, doing push-ups and sit-ups for 90 minutes 3-5 times per week.  Today's visit was #: 9 Starting weight: 226 lbs Starting date: 02/25/2021 Today's weight: 194 lbs Today's date: 08/10/2021 Total lbs lost to date: 32 Total lbs lost since last in-office visit: 3  Interim History: Brett Wilcox plan is going well, and he is without hunger or cravings. He is able to have celebrations and events, and still make smart choices, and portion control as of lately. He is doing a lot of cardio for 90 minutes 3-4 days per week. He does push-ups and sit-ups daily.  Subjective:   1. Essential hypertension Brett Wilcox is taking hydrochlorothiazide. His blood pressure is at goal today. Cardiovascular ROS: no chest pain or dyspnea on exertion.  BP Readings from Last 3 Encounters:  08/10/21 124/79  07/27/21 127/73  07/06/21 126/80   Assessment/Plan:  No orders of the defined types were placed in this encounter.   There are no discontinued medications.   No orders of the defined types were placed in this encounter.    1. Essential hypertension Brett Wilcox will continue his medications, weight loss, exercise, and prudent nutritional plan. He will watch for signs of hypotension as he continues his lifestyle modifications.  2. Obesity with current BMI of 28.7 Brett Wilcox is currently in the action stage of change. As such, his goal is to continue with weight loss efforts. He has agreed to the Category 3 Plan.   Brett Wilcox's desired weight is approximately 180 lbs, he thinks.   Exercise goals: As is. I recommended 2 days of weight lifting per week in addition to his current regimen.  Behavioral modification strategies:  increasing lean protein intake, decreasing simple carbohydrates, and planning for success.  Brett Wilcox has agreed to follow-up with our clinic in 3 weeks. He was informed of the importance of frequent follow-up visits to maximize his success with intensive lifestyle modifications for his multiple health conditions.   Objective:   Blood pressure 124/79, pulse 61, temperature 97.9 F (36.6 C), height 5\' 9"  (1.753 m), weight 194 lb (88 kg), SpO2 97 %. Body mass index is 28.65 kg/m.  General: Cooperative, alert, well developed, in no acute distress. HEENT: Conjunctivae and lids unremarkable. Cardiovascular: Regular rhythm.  Lungs: Normal work of breathing. Neurologic: No focal deficits.   Lab Results  Component Value Date   CREATININE 0.96 02/25/2021   BUN 17 02/25/2021   NA 138 02/25/2021   K 4.5 02/25/2021   CL 97 02/25/2021   CO2 25 02/25/2021   Lab Results  Component Value Date   ALT 44 02/25/2021   AST 29 02/25/2021   ALKPHOS 64 02/25/2021   BILITOT 0.6 02/25/2021   Lab Results  Component Value Date   HGBA1C 5.5 06/16/2021   HGBA1C 6.1 (H) 02/25/2021   Lab Results  Component Value Date   INSULIN 12.2 06/16/2021   INSULIN 23.8 02/25/2021   Lab Results  Component Value Date   TSH 3.320 02/25/2021   Lab Results  Component Value Date   CHOL 139 06/16/2021   HDL 41 06/16/2021   LDLCALC 80 06/16/2021   TRIG 96 06/16/2021   CHOLHDL 3.9 02/25/2021   Lab Results  Component Value  Date   VD25OH 77.3 06/16/2021   VD25OH 25.7 (L) 02/25/2021   Lab Results  Component Value Date   WBC 6.0 06/16/2021   HGB 19.4 (H) 06/16/2021   HCT 58.7 (H) 06/16/2021   MCV 90 06/16/2021   PLT 222 06/16/2021   No results found for: IRON, TIBC, FERRITIN  Attestation Statements:   Reviewed by clinician on day of visit: allergies, medications, problem list, medical history, surgical history, family history, social history, and previous encounter notes.   Trude Mcburney, am acting  as transcriptionist for [employer], DO.  I have reviewed the above documentation for accuracy and completeness, and I agree with the above. [name], D.O.  The 21st Century Cures Act was signed into law in 2016 which includes the topic of electronic health records.  This provides immediate access to information in MyChart.  This includes consultation notes, operative notes, office notes, lab results and pathology reports.  If you have any questions about what you read please let us know at your next visit so we can discuss your concerns and take corrective action if need be.  We are right here with you.

## 2021-08-31 ENCOUNTER — Ambulatory Visit (INDEPENDENT_AMBULATORY_CARE_PROVIDER_SITE_OTHER): Payer: BC Managed Care – PPO | Admitting: Family Medicine

## 2021-08-31 ENCOUNTER — Encounter (INDEPENDENT_AMBULATORY_CARE_PROVIDER_SITE_OTHER): Payer: Self-pay | Admitting: Family Medicine

## 2021-08-31 ENCOUNTER — Other Ambulatory Visit: Payer: Self-pay

## 2021-08-31 VITALS — BP 129/82 | HR 63 | Temp 97.5°F | Ht 69.0 in | Wt 193.0 lb

## 2021-08-31 DIAGNOSIS — Z9189 Other specified personal risk factors, not elsewhere classified: Secondary | ICD-10-CM

## 2021-08-31 DIAGNOSIS — E559 Vitamin D deficiency, unspecified: Secondary | ICD-10-CM

## 2021-08-31 DIAGNOSIS — Z6833 Body mass index (BMI) 33.0-33.9, adult: Secondary | ICD-10-CM | POA: Diagnosis not present

## 2021-08-31 DIAGNOSIS — E669 Obesity, unspecified: Secondary | ICD-10-CM | POA: Diagnosis not present

## 2021-08-31 MED ORDER — VITAMIN D (ERGOCALCIFEROL) 1.25 MG (50000 UNIT) PO CAPS: 50000.0000 [IU] | ORAL_CAPSULE | ORAL | 0 refills | Status: DC

## 2021-09-01 NOTE — Progress Notes (Signed)
Chief Complaint:   OBESITY Brett Wilcox is here to discuss his progress with his obesity treatment plan along with follow-up of his obesity related diagnoses. Brett Wilcox is on the Category 3 Plan and states he is following his eating plan approximately 80% of the time. Brett Wilcox states he is doing push ups, sit ups, walking, and playing Tennis for 60 minutes 3 times per week.  Today's visit was #: 10 Starting weight: 226 lbs Starting date: 02/25/2021 Today's weight: 193 lbs Today's date: 08/31/2021 Total lbs lost to date: 33 Total lbs lost since last in-office visit: 1  Interim History: Brett Wilcox increased his exercise over the holiday and he is happy with his 1 lb weight loss. Brett Wilcox is here for a follow up office visit. We reviewed his meal plan and questions were answered. Patient's food recall appears to be accurate and consistent with what is on plan when he is following it. When eating on plan, his hunger and cravings are well controlled.    Subjective:   1. Vitamin D deficiency Brett Wilcox changed to 2,000 IU Vit D3 q daily, instead of prescription Vit D on 07/27/2021. He feels the once weekly worked better. His last Vit D level was 77 in mid September, and we are going into Winter and his sun exposure will significantly decrease at this time.  2. At risk for constipation Brett Wilcox is at increased risk for constipation due to increased protein and inadequate water intake.  Assessment/Plan:  No orders of the defined types were placed in this encounter.   There are no discontinued medications.   Meds ordered this encounter  Medications   Vitamin D, Ergocalciferol, (DRISDOL) 1.25 MG (50000 UNIT) CAPS capsule    Sig: Take 1 capsule (50,000 Units total) by mouth every 7 (seven) days.    Dispense:  4 capsule    Refill:  0    We see pt monthly- please dispense 1 mo supply.     1. Vitamin D deficiency Brett Wilcox agreed to restart prescription Vitamin D 50,000 IU every week and we will  refill for 1 month. We will recheck his Vit D leve in 2 to 3 months at the end of January-early February. He will follow-up for routine testing of Vitamin D, at least 2-3 times per year to avoid over-replacement.  - Vitamin D, Ergocalciferol, (DRISDOL) 1.25 MG (50000 UNIT) CAPS capsule; Take 1 capsule (50,000 Units total) by mouth every 7 (seven) days.  Dispense: 4 capsule; Refill: 0  2. At risk for constipation Brett Wilcox was given approximately 15 minutes of counseling today regarding prevention of constipation. He was encouraged to increase water and fiber intake.   3. Obesity with current BMI of 28.6 Brett Wilcox is currently in the action stage of change. As such, his goal is to continue with weight loss efforts. He has agreed to the Category 3 Plan.   Exercise goals: As is.  Behavioral modification strategies: increasing lean protein intake, decreasing simple carbohydrates, and celebration eating strategies.  Brett Wilcox has agreed to follow-up with our clinic in 2 to 3 weeks. He was informed of the importance of frequent follow-up visits to maximize his success with intensive lifestyle modifications for his multiple health conditions.   Objective:   Blood pressure 129/82, pulse 63, temperature (!) 97.5 F (36.4 C), height 5\' 9"  (1.753 m), weight 193 lb (87.5 kg), SpO2 96 %. Body mass index is 28.5 kg/m.  General: Cooperative, alert, well developed, in no acute distress. HEENT: Conjunctivae and lids unremarkable.  Cardiovascular: Regular rhythm.  Lungs: Normal work of breathing. Neurologic: No focal deficits.   Lab Results  Component Value Date   CREATININE 0.96 02/25/2021   BUN 17 02/25/2021   NA 138 02/25/2021   K 4.5 02/25/2021   CL 97 02/25/2021   CO2 25 02/25/2021   Lab Results  Component Value Date   ALT 44 02/25/2021   AST 29 02/25/2021   ALKPHOS 64 02/25/2021   BILITOT 0.6 02/25/2021   Lab Results  Component Value Date   HGBA1C 5.5 06/16/2021   HGBA1C 6.1 (H) 02/25/2021    Lab Results  Component Value Date   INSULIN 12.2 06/16/2021   INSULIN 23.8 02/25/2021   Lab Results  Component Value Date   TSH 3.320 02/25/2021   Lab Results  Component Value Date   CHOL 139 06/16/2021   HDL 41 06/16/2021   LDLCALC 80 06/16/2021   TRIG 96 06/16/2021   CHOLHDL 3.9 02/25/2021   Lab Results  Component Value Date   VD25OH 77.3 06/16/2021   VD25OH 25.7 (L) 02/25/2021   Lab Results  Component Value Date   WBC 6.0 06/16/2021   HGB 19.4 (H) 06/16/2021   HCT 58.7 (H) 06/16/2021   MCV 90 06/16/2021   PLT 222 06/16/2021   No results found for: IRON, TIBC, FERRITIN  Attestation Statements:   Reviewed by clinician on day of visit: allergies, medications, problem list, medical history, surgical history, family history, social history, and previous encounter notes.   Trude Mcburney, am acting as transcriptionist for [employer], DO.  I have reviewed the above documentation for accuracy and completeness, and I agree with the above. [name], D.O.  The 21st Century Cures Act was signed into law in 2016 which includes the topic of electronic health records.  This provides immediate access to information in MyChart.  This includes consultation notes, operative notes, office notes, lab results and pathology reports.  If you have any questions about what you read please let us know at your next visit so we can discuss your concerns and take corrective action if need be.  We are right here with you.

## 2021-09-02 ENCOUNTER — Encounter (INDEPENDENT_AMBULATORY_CARE_PROVIDER_SITE_OTHER): Payer: Self-pay | Admitting: Family Medicine

## 2021-09-02 DIAGNOSIS — Z5181 Encounter for therapeutic drug level monitoring: Secondary | ICD-10-CM | POA: Diagnosis not present

## 2021-09-02 DIAGNOSIS — Z125 Encounter for screening for malignant neoplasm of prostate: Secondary | ICD-10-CM | POA: Diagnosis not present

## 2021-09-02 DIAGNOSIS — Z0000 Encounter for general adult medical examination without abnormal findings: Secondary | ICD-10-CM | POA: Diagnosis not present

## 2021-09-02 DIAGNOSIS — M1A00X0 Idiopathic chronic gout, unspecified site, without tophus (tophi): Secondary | ICD-10-CM | POA: Diagnosis not present

## 2021-09-02 DIAGNOSIS — N200 Calculus of kidney: Secondary | ICD-10-CM | POA: Diagnosis not present

## 2021-09-02 DIAGNOSIS — E291 Testicular hypofunction: Secondary | ICD-10-CM | POA: Diagnosis not present

## 2021-09-02 DIAGNOSIS — E6609 Other obesity due to excess calories: Secondary | ICD-10-CM | POA: Diagnosis not present

## 2021-09-02 DIAGNOSIS — E782 Mixed hyperlipidemia: Secondary | ICD-10-CM | POA: Diagnosis not present

## 2021-09-02 DIAGNOSIS — K429 Umbilical hernia without obstruction or gangrene: Secondary | ICD-10-CM | POA: Diagnosis not present

## 2021-09-13 NOTE — Telephone Encounter (Signed)
Please review

## 2021-09-21 ENCOUNTER — Ambulatory Visit (INDEPENDENT_AMBULATORY_CARE_PROVIDER_SITE_OTHER): Payer: BC Managed Care – PPO | Admitting: Family Medicine

## 2021-09-21 ENCOUNTER — Encounter (INDEPENDENT_AMBULATORY_CARE_PROVIDER_SITE_OTHER): Payer: Self-pay | Admitting: Family Medicine

## 2021-09-21 ENCOUNTER — Other Ambulatory Visit: Payer: Self-pay

## 2021-09-21 VITALS — BP 128/81 | HR 64 | Ht 69.0 in | Wt 192.0 lb

## 2021-09-21 DIAGNOSIS — Z6833 Body mass index (BMI) 33.0-33.9, adult: Secondary | ICD-10-CM

## 2021-09-21 DIAGNOSIS — E559 Vitamin D deficiency, unspecified: Secondary | ICD-10-CM | POA: Diagnosis not present

## 2021-09-21 DIAGNOSIS — E669 Obesity, unspecified: Secondary | ICD-10-CM

## 2021-09-21 MED ORDER — VITAMIN D (ERGOCALCIFEROL) 1.25 MG (50000 UNIT) PO CAPS: 50000.0000 [IU] | ORAL_CAPSULE | ORAL | 0 refills | Status: DC

## 2021-09-22 NOTE — Progress Notes (Signed)
Chief Complaint:   OBESITY Brett Wilcox is here to discuss his progress with his obesity treatment plan along with follow-up of his obesity related diagnoses. Brett Wilcox is on the Category 3 Plan and states he is following his eating plan approximately 85% of the time. Brett Wilcox states he is on the elliptical, playing Tennis, doing push-ups and sit-ups for 30 minutes 5 times per week.  Today's visit was #: 11 Starting weight: 226 lbs Starting date: 02/25/2021 Today's weight: 192 lbs Today's date: 09/21/2021 Total lbs lost to date: 34 Total lbs lost since last in-office visit: 1  Interim History: Brett Wilcox increased his workout and lifting more on his own at home. He lost almost 1 lb in fat mass since his last office visit.  Subjective:   1. Vitamin D deficiency Brett Wilcox is currently taking prescription vitamin D 50,000 IU each week. He denies nausea, vomiting or muscle weakness.  Assessment/Plan:  No orders of the defined types were placed in this encounter.   Medications Discontinued During This Encounter  Medication Reason   Testosterone 1.62 % GEL    Vitamin D, Ergocalciferol, (DRISDOL) 1.25 MG (50000 UNIT) CAPS capsule Reorder     Meds ordered this encounter  Medications   Vitamin D, Ergocalciferol, (DRISDOL) 1.25 MG (50000 UNIT) CAPS capsule    Sig: Take 1 capsule (50,000 Units total) by mouth every 7 (seven) days.    Dispense:  4 capsule    Refill:  0    We see pt monthly- please dispense 1 mo supply.     1. Vitamin D deficiency We will refill prescription Vitamin D for 1 month. Brett Wilcox will follow-up for routine testing of Vitamin D, at least 2-3 times per year to avoid over-replacement.  - Vitamin D, Ergocalciferol, (DRISDOL) 1.25 MG (50000 UNIT) CAPS capsule; Take 1 capsule (50,000 Units total) by mouth every 7 (seven) days.  Dispense: 4 capsule; Refill: 0  2. Obesity with current BMI of 28.4 Brett Wilcox is currently in the action stage of change. As such, his goal is to continue  with weight loss efforts. He has agreed to the Category 3 Plan.   Holiday eating strategies were discussed with the patient today.  Exercise goals: As is.  Behavioral modification strategies: increasing lean protein intake, decreasing simple carbohydrates, and planning for success.  Brett Wilcox has agreed to follow-up with our clinic in 3 to 4 weeks. He was informed of the importance of frequent follow-up visits to maximize his success with intensive lifestyle modifications for his multiple health conditions.   Objective:   Blood pressure 128/81, pulse 64, height 5\' 9"  (1.753 m), weight 192 lb (87.1 kg), SpO2 97 %. Body mass index is 28.35 kg/m.  General: Cooperative, alert, well developed, in no acute distress. HEENT: Conjunctivae and lids unremarkable. Cardiovascular: Regular rhythm.  Lungs: Normal work of breathing. Neurologic: No focal deficits.   Lab Results  Component Value Date   CREATININE 0.96 02/25/2021   BUN 17 02/25/2021   NA 138 02/25/2021   K 4.5 02/25/2021   CL 97 02/25/2021   CO2 25 02/25/2021   Lab Results  Component Value Date   ALT 44 02/25/2021   AST 29 02/25/2021   ALKPHOS 64 02/25/2021   BILITOT 0.6 02/25/2021   Lab Results  Component Value Date   HGBA1C 5.5 06/16/2021   HGBA1C 6.1 (H) 02/25/2021   Lab Results  Component Value Date   INSULIN 12.2 06/16/2021   INSULIN 23.8 02/25/2021   Lab Results  Component Value  Date   TSH 3.320 02/25/2021   Lab Results  Component Value Date   CHOL 139 06/16/2021   HDL 41 06/16/2021   LDLCALC 80 06/16/2021   TRIG 96 06/16/2021   CHOLHDL 3.9 02/25/2021   Lab Results  Component Value Date   VD25OH 77.3 06/16/2021   VD25OH 25.7 (L) 02/25/2021   Lab Results  Component Value Date   WBC 6.0 06/16/2021   HGB 19.4 (H) 06/16/2021   HCT 58.7 (H) 06/16/2021   MCV 90 06/16/2021   PLT 222 06/16/2021   No results found for: IRON, TIBC, FERRITIN  Attestation Statements:   Reviewed by clinician on day of  visit: allergies, medications, problem list, medical history, surgical history, family history, social history, and previous encounter notes.   Trude Mcburney, am acting as transcriptionist for [employer], DO.  I have reviewed the above documentation for accuracy and completeness, and I agree with the above. [name], D.O.  The 21st Century Cures Act was signed into law in 2016 which includes the topic of electronic health records.  This provides immediate access to information in MyChart.  This includes consultation notes, operative notes, office notes, lab results and pathology reports.  If you have any questions about what you read please let us know at your next visit so we can discuss your concerns and take corrective action if need be.  We are right here with you.

## 2021-10-14 ENCOUNTER — Encounter (INDEPENDENT_AMBULATORY_CARE_PROVIDER_SITE_OTHER): Payer: Self-pay | Admitting: Family Medicine

## 2021-10-14 ENCOUNTER — Ambulatory Visit (INDEPENDENT_AMBULATORY_CARE_PROVIDER_SITE_OTHER): Payer: BC Managed Care – PPO | Admitting: Family Medicine

## 2021-10-14 ENCOUNTER — Other Ambulatory Visit: Payer: Self-pay

## 2021-10-14 VITALS — BP 119/76 | HR 60 | Temp 97.5°F | Ht 69.0 in | Wt 191.0 lb

## 2021-10-14 DIAGNOSIS — D582 Other hemoglobinopathies: Secondary | ICD-10-CM

## 2021-10-14 DIAGNOSIS — R7303 Prediabetes: Secondary | ICD-10-CM | POA: Diagnosis not present

## 2021-10-14 DIAGNOSIS — Z6828 Body mass index (BMI) 28.0-28.9, adult: Secondary | ICD-10-CM

## 2021-10-14 DIAGNOSIS — E559 Vitamin D deficiency, unspecified: Secondary | ICD-10-CM

## 2021-10-14 DIAGNOSIS — E669 Obesity, unspecified: Secondary | ICD-10-CM | POA: Diagnosis not present

## 2021-10-14 DIAGNOSIS — Z6833 Body mass index (BMI) 33.0-33.9, adult: Secondary | ICD-10-CM

## 2021-10-14 MED ORDER — VITAMIN D (ERGOCALCIFEROL) 1.25 MG (50000 UNIT) PO CAPS: 50000.0000 [IU] | ORAL_CAPSULE | ORAL | 0 refills | Status: DC

## 2021-10-18 NOTE — Progress Notes (Signed)
Chief Complaint:   OBESITY Brett Wilcox is here to discuss his progress with his obesity treatment plan along with follow-up of his obesity related diagnoses. Brett Wilcox is on the Category 3 Plan and states he is following his eating plan approximately 85% of the time. Brett Wilcox states he is on the elliptical, doing push ups and sit ups for exercise sessions of 30-60 minutes 7 times per week.  Today's visit was #: 12 Starting weight: 226 lbs Starting date: 02/25/2021 Today's weight: 191 lbs Today's date: 10/14/2021 Total lbs lost to date: 35 Total lbs lost since last in-office visit: 1  Interim History: Brett Wilcox did extra exercise on the elliptical to help with his "off the plan" holiday eating. He was hoping to lose more weight. He is leaving for [facility] in the near future, but he is not worried about eating on vacation. He denies hunger or cravings, and he has no issues with the meal plan.  Subjective:   1. Pre-diabetes Brett Wilcox's A1c was initially 6.1 at the start of the program, and approximately 4 months ago it was 5.5 on 06/16/2021. It is well controlled with lifestyle changes he has made.  2. Vitamin D deficiency Brett Wilcox Vit D was at goal at 77.3 on 06/16/2021, up from 25 prior. He is tolerating medication(s) well without side effects.  Medication compliance is good and patient appears to be taking it as prescribed.  Denies additional concerns regarding this condition.   3. Elevated hemoglobin (Brett Wilcox) Brett Wilcox's primary care physician did change his testosterone treatment a couple of months ago. He has no symptoms or concerns.  Assessment/Plan:  No orders of the defined types were placed in this encounter.   Medications Discontinued During This Encounter  Medication Reason   Vitamin D, Ergocalciferol, (DRISDOL) 1.25 MG (50000 UNIT) CAPS capsule Reorder     Meds ordered this encounter  Medications   Vitamin D, Ergocalciferol, (DRISDOL) 1.25 MG (50000 UNIT) CAPS capsule    Sig: Take 1  capsule (50,000 Units total) by mouth every 7 (seven) days.    Dispense:  4 capsule    Refill:  0    We see pt monthly- please dispense 1 mo supply.     1. Pre-diabetes Brett Wilcox will continue to work on weight loss, exercise, and decreasing simple carbohydrates to help decrease the risk of diabetes.   2. Vitamin D deficiency We will refill prescription Vitamin D for 1 month. Over the next 1-2 months, we will consider rechecking his Vit D level since it will be mid to late Winter season when Vit D should be at it's lowest.  - Vitamin D, Ergocalciferol, (DRISDOL) 1.25 MG (50000 UNIT) CAPS capsule; Take 1 capsule (50,000 Units total) by mouth every 7 (seven) days.  Dispense: 4 capsule; Refill: 0  3. Elevated hemoglobin (Green) I asked Brett Wilcox to bring in a copy of all of his labs that were done in the past 3 months with his primary care physician. We will consider rechecking his hemoglobin and hematocrit if they have not been done recently. He will continue his low testosterone treatment per his primary care physician. I counseled the patient that exercise and weight lifting as well as weight loss all increases his testosterone levels.  4. Obesity with current BMI of28.3 Brett Wilcox is currently in the action stage of change. As such, his goal is to continue with weight loss efforts. He has agreed to the Category 3 Plan.   Brett Wilcox was given recipes today, and cooking strategies were  discussed with the patient.  Exercise goals: As is.  Behavioral modification strategies: meal planning and cooking strategies, travel eating strategies, and planning for success.  Brett Wilcox has agreed to follow-up with our clinic in 3 to 4 weeks. He was informed of the importance of frequent follow-up visits to maximize his success with intensive lifestyle modifications for his multiple health conditions.   Objective:   Blood pressure 119/76, pulse 60, temperature (!) 97.5 F (36.4 C), height 5\' 9"  (1.753 m), weight 191 lb  (86.6 kg), SpO2 98 %. Body mass index is 28.21 kg/m.  General: Cooperative, alert, well developed, in no acute distress. HEENT: Conjunctivae and lids unremarkable. Cardiovascular: Regular rhythm.  Lungs: Normal work of breathing. Neurologic: No focal deficits.   Lab Results  Component Value Date   CREATININE 0.96 02/25/2021   BUN 17 02/25/2021   NA 138 02/25/2021   K 4.5 02/25/2021   CL 97 02/25/2021   CO2 25 02/25/2021   Lab Results  Component Value Date   ALT 44 02/25/2021   AST 29 02/25/2021   ALKPHOS 64 02/25/2021   BILITOT 0.6 02/25/2021   Lab Results  Component Value Date   HGBA1C 5.5 06/16/2021   HGBA1C 6.1 (H) 02/25/2021   Lab Results  Component Value Date   INSULIN 12.2 06/16/2021   INSULIN 23.8 02/25/2021   Lab Results  Component Value Date   TSH 3.320 02/25/2021   Lab Results  Component Value Date   CHOL 139 06/16/2021   HDL 41 06/16/2021   LDLCALC 80 06/16/2021   TRIG 96 06/16/2021   CHOLHDL 3.9 02/25/2021   Lab Results  Component Value Date   VD25OH 77.3 06/16/2021   VD25OH 25.7 (L) 02/25/2021   Lab Results  Component Value Date   WBC 6.0 06/16/2021   HGB 19.4 (H) 06/16/2021   HCT 58.7 (H) 06/16/2021   MCV 90 06/16/2021   PLT 222 06/16/2021   No results found for: IRON, TIBC, FERRITIN  Attestation Statements:   Reviewed by clinician on day of visit: allergies, medications, problem list, medical history, surgical history, family history, social history, and previous encounter notes.   Wilhemena Durie, am acting as transcriptionist for [employer], DO.  I have reviewed the above documentation for accuracy and completeness, and I agree with the above. Marjory Sneddon, D.O.  The [place] was signed into law in 2016 which includes the topic of electronic health records.  This provides immediate access to information in MyChart.  This includes consultation notes, operative notes, office notes, lab results and  pathology reports.  If you have any questions about what you read please let us know at your next visit so we can discuss your concerns and take corrective action if need be.  We are right here with you.

## 2021-10-26 DIAGNOSIS — K429 Umbilical hernia without obstruction or gangrene: Secondary | ICD-10-CM | POA: Diagnosis not present

## 2021-11-04 ENCOUNTER — Encounter (INDEPENDENT_AMBULATORY_CARE_PROVIDER_SITE_OTHER): Payer: Self-pay | Admitting: Family Medicine

## 2021-11-04 ENCOUNTER — Ambulatory Visit (INDEPENDENT_AMBULATORY_CARE_PROVIDER_SITE_OTHER): Payer: BC Managed Care – PPO | Admitting: Family Medicine

## 2021-11-04 ENCOUNTER — Other Ambulatory Visit: Payer: Self-pay

## 2021-11-04 VITALS — BP 128/75 | HR 62 | Temp 97.9°F | Ht 69.0 in | Wt 192.0 lb

## 2021-11-04 DIAGNOSIS — E559 Vitamin D deficiency, unspecified: Secondary | ICD-10-CM | POA: Diagnosis not present

## 2021-11-04 DIAGNOSIS — T387X5D Adverse effect of androgens and anabolic congeners, subsequent encounter: Secondary | ICD-10-CM | POA: Diagnosis not present

## 2021-11-04 DIAGNOSIS — Z6828 Body mass index (BMI) 28.0-28.9, adult: Secondary | ICD-10-CM

## 2021-11-04 DIAGNOSIS — E669 Obesity, unspecified: Secondary | ICD-10-CM | POA: Diagnosis not present

## 2021-11-04 DIAGNOSIS — Z9189 Other specified personal risk factors, not elsewhere classified: Secondary | ICD-10-CM | POA: Diagnosis not present

## 2021-11-04 DIAGNOSIS — M25531 Pain in right wrist: Secondary | ICD-10-CM

## 2021-11-04 MED ORDER — VITAMIN D (ERGOCALCIFEROL) 1.25 MG (50000 UNIT) PO CAPS: 50000.0000 [IU] | ORAL_CAPSULE | ORAL | 0 refills | Status: DC

## 2021-11-08 NOTE — Progress Notes (Signed)
Chief Complaint:   OBESITY Brett Wilcox is here to discuss his progress with his obesity treatment plan along with follow-up of his obesity related diagnoses. Brett Wilcox is on the Category 3 Plan and states he is following his eating plan approximately 80% of the time. Brett Wilcox states he is doing sit-ups for 5 minutes  5 times per week, playing Tennis for 90 minutes 1 time per week, and walking for 45 minutes 3 times per week.  Today's visit was #: 35 Starting weight: 226 lbs Starting date: 02/25/2021 Today's weight: 192 lbs Today's date: 11/04/2021 Total lbs lost to date: 34 Total lbs lost since last in-office visit: 0  Interim History: Brett Wilcox has right wrist pain, and unable to play Tennis and lift much for 2 weeks. He hasn't gotten it checked out yet. Pain with wrist flexion and extension only at the wrist. He is going to [place] with buddies in the near future.   Subjective:   1. Adverse effect of testosterone, subsequent encounter Brett Wilcox is with elevated hemoglobin and hematocrit. Per patient, his primary care physician stopped testosterone treatments. He is now with decreased libido and he feels worse. His primary care physician recently obtained labs. His TSH is 4.65, testosterone 254.5, CBC on 09/02/2021 with PCP with hemoglobin and hematocrit.   2. Vitamin D deficiency Brett Wilcox is currently taking prescription vitamin D 50,000 IU each week. Last Vit D level on 9/22 was 77.3. He denies nausea, vomiting or muscle weakness.  3. Wrist pain, right Brett Wilcox plays Tennis and he gets lessons via [employer]. His wrist started aching with flexion and extension of wrist. He hasn't seen his doctor for it.  4. At risk for activity intolerance Brett Wilcox is at risk for exercise intolerance due to current injury.  Assessment/Plan:   1. Adverse effect of testosterone, subsequent encounter Labs were reviewed from PCP from 09/02/2021, and his hemoglobin level has improved since since they discontinued  testosterone treatments. He is to follow up with his primary care physician regarding testosterone treatments.   2. Vitamin D deficiency [name] was told that he needs a repeat Vit D level, and he declines today but will consider at his next office visit. We will refill prescription Vitamin D for 1 month. [name] will follow-up for routine testing of Vitamin D, at least 2-3 times per year to avoid over-replacement.  - Vitamin D, Ergocalciferol, (DRISDOL) 1.25 MG (50000 UNIT) CAPS capsule; Take 1 capsule (50,000 Units total) by mouth every 7 (seven) days.  Dispense: 4 capsule; Refill: 0  3. Wrist pain, right I recommended a carpal tunnel immobilizer at night, and decrease repetitive activity (tennis) 2-4 times per week or more, and ice. If no improvement or worse, he is to follow up with sports medicine or ortho (wrist upper ext specialist).  4. At risk for activity intolerance [name] was given approximately 9 minutes of exercise intolerance counseling today. He is 56 y.o. male and has risk factors exercise intolerance including obesity. We discussed intensive lifestyle modifications today with an emphasis on specific weight loss instructions and strategies. Brett Wilcox will slowly increase activity as tolerated.  Repetitive spaced learning was employed today to elicit superior memory formation and behavioral change.   5. Obesity with current BMI of 28.4 Brett Wilcox is currently in the action stage of change. As such, his goal is to continue with weight loss efforts. He has agreed to the Category 3 Plan.   We will consider rechecking A1c and Vit D at his next office visit.  Exercise goals: As is, and as tolerated with injury.  Behavioral modification strategies: travel eating strategies and planning for success.  Brett Wilcox has agreed to follow-up with our clinic in 3 to 4 weeks. He was informed of the importance of frequent follow-up visits to maximize his success with intensive lifestyle modifications for  his multiple health conditions.   Objective:   Blood pressure 128/75, pulse 62, temperature 97.9 F (36.6 C), height 5\' 9"  (1.753 m), weight 192 lb (87.1 kg), SpO2 98 %. Body mass index is 28.35 kg/m.  General: Cooperative, alert, well developed, in no acute distress. HEENT: Conjunctivae and lids unremarkable. Cardiovascular: Regular rhythm.  Lungs: Normal work of breathing. Neurologic: No focal deficits.   Lab Results  Component Value Date   CREATININE 0.96 02/25/2021   BUN 17 02/25/2021   NA 138 02/25/2021   K 4.5 02/25/2021   CL 97 02/25/2021   CO2 25 02/25/2021   Lab Results  Component Value Date   ALT 44 02/25/2021   AST 29 02/25/2021   ALKPHOS 64 02/25/2021   BILITOT 0.6 02/25/2021   Lab Results  Component Value Date   HGBA1C 5.5 06/16/2021   HGBA1C 6.1 (H) 02/25/2021   Lab Results  Component Value Date   INSULIN 12.2 06/16/2021   INSULIN 23.8 02/25/2021   Lab Results  Component Value Date   TSH 3.320 02/25/2021   Lab Results  Component Value Date   CHOL 139 06/16/2021   HDL 41 06/16/2021   LDLCALC 80 06/16/2021   TRIG 96 06/16/2021   CHOLHDL 3.9 02/25/2021   Lab Results  Component Value Date   VD25OH 77.3 06/16/2021   VD25OH 25.7 (L) 02/25/2021   Lab Results  Component Value Date   WBC 6.0 06/16/2021   HGB 19.4 (H) 06/16/2021   HCT 58.7 (H) 06/16/2021   MCV 90 06/16/2021   PLT 222 06/16/2021   No results found for: IRON, TIBC, FERRITIN  Attestation Statements:   Reviewed by clinician on day of visit: allergies, medications, problem list, medical history, surgical history, family history, social history, and previous encounter notes.  Wilhemena Durie, am acting as transcriptionist for [employer], DO.  I have reviewed the above documentation for accuracy and completeness, and I agree with the above. Marjory Sneddon, D.O.  The [place] was signed into law in 2016 which includes the topic of electronic health  records.  This provides immediate access to information in MyChart.  This includes consultation notes, operative notes, office notes, lab results and pathology reports.  If you have any questions about what you read please let us know at your next visit so we can discuss your concerns and take corrective action if need be.  We are right here with you.

## 2021-11-10 DIAGNOSIS — D751 Secondary polycythemia: Secondary | ICD-10-CM | POA: Diagnosis not present

## 2021-11-10 DIAGNOSIS — E291 Testicular hypofunction: Secondary | ICD-10-CM | POA: Diagnosis not present

## 2021-11-10 DIAGNOSIS — E039 Hypothyroidism, unspecified: Secondary | ICD-10-CM | POA: Diagnosis not present

## 2021-12-02 ENCOUNTER — Encounter (INDEPENDENT_AMBULATORY_CARE_PROVIDER_SITE_OTHER): Payer: Self-pay | Admitting: Family Medicine

## 2021-12-02 ENCOUNTER — Other Ambulatory Visit: Payer: Self-pay

## 2021-12-02 ENCOUNTER — Ambulatory Visit (INDEPENDENT_AMBULATORY_CARE_PROVIDER_SITE_OTHER): Payer: BC Managed Care – PPO | Admitting: Family Medicine

## 2021-12-02 ENCOUNTER — Other Ambulatory Visit (INDEPENDENT_AMBULATORY_CARE_PROVIDER_SITE_OTHER): Payer: Self-pay | Admitting: Family Medicine

## 2021-12-02 VITALS — BP 121/76 | HR 66 | Temp 98.2°F | Ht 69.0 in | Wt 190.0 lb

## 2021-12-02 DIAGNOSIS — K429 Umbilical hernia without obstruction or gangrene: Secondary | ICD-10-CM

## 2021-12-02 DIAGNOSIS — R7989 Other specified abnormal findings of blood chemistry: Secondary | ICD-10-CM | POA: Diagnosis not present

## 2021-12-02 DIAGNOSIS — Z9189 Other specified personal risk factors, not elsewhere classified: Secondary | ICD-10-CM | POA: Diagnosis not present

## 2021-12-02 DIAGNOSIS — E559 Vitamin D deficiency, unspecified: Secondary | ICD-10-CM | POA: Diagnosis not present

## 2021-12-02 DIAGNOSIS — E669 Obesity, unspecified: Secondary | ICD-10-CM

## 2021-12-02 DIAGNOSIS — I10 Essential (primary) hypertension: Secondary | ICD-10-CM | POA: Diagnosis not present

## 2021-12-02 DIAGNOSIS — Z6833 Body mass index (BMI) 33.0-33.9, adult: Secondary | ICD-10-CM

## 2021-12-02 DIAGNOSIS — Z6828 Body mass index (BMI) 28.0-28.9, adult: Secondary | ICD-10-CM

## 2021-12-02 MED ORDER — VITAMIN D (ERGOCALCIFEROL) 1.25 MG (50000 UNIT) PO CAPS: 50000.0000 [IU] | ORAL_CAPSULE | ORAL | 0 refills | Status: DC

## 2021-12-06 NOTE — Progress Notes (Signed)
? ? ? ?Chief Complaint:  ? ?OBESITY ?Brett Wilcox is here to discuss his progress with his obesity treatment plan along with follow-up of his obesity related diagnoses. Darrius is on the Category 3 Plan and states he is following his eating plan approximately 80% of the time. Bonner states he is playing Tennis for 90 minutes 1 time per week, and walking and on the elliptical for 45 minutes 2 times per week. ? ?Today's visit was #: 14 ?Starting weight: 226 lbs ?Starting date: 02/25/2021 ?Today's weight: 190 lbs ?Today's date: 12/02/2021 ?Total lbs lost to date: 77 ?Total lbs lost since last in-office visit: 2 ? ?Interim History: Coden lost 3 lbs of fat mass and gained 2 lbs in muscle mass since his last office visit. He is doing great and with no issues. ? ? ? ?Subjective:  ? ?1. Umbilical hernia without obstruction and without gangrene ?[name] told me that his PCP sent him to Dr. Luisa Hart of general surgery for evaluation. He opted for surgery, and will be having it next Thursday.  ? ?2. Vitamin D deficiency ?[name] is currently taking prescription vitamin D 50,000 IU each week. He denies nausea, vomiting or muscle weakness. ? ?3. Essential hypertension ?[name] is taking hydrochlorothiazide. Asx, no concerns ? ?BP Readings from Last 3 Encounters:  ?12/02/21 121/76  ?11/04/21 128/75  ?10/14/21 119/76  ? ?4. Low testosterone ?[name] was recently seen by his PCP regarding elevated hemoglobin and hematocrit. His PCP opted to restart him on a low dose testosterone. ? ?5. At risk for impaired function of liver ?[name] is at increased risk for impaired liver function due to being overweight and ETOH intake. ? ?Assessment/Plan:  ? ?Medications Discontinued During This Encounter  ?Medication Reason  ? Vitamin D, Ergocalciferol, (DRISDOL) 1.25 MG (50000 UNIT) CAPS capsule Reorder  ?  ? ?Meds ordered this encounter  ?Medications  ? Vitamin D, Ergocalciferol, (DRISDOL) 1.25 MG (50000 UNIT) CAPS capsule  ?  Sig: Take 1 capsule (50,000  Units total) by mouth every 7 (seven) days.  ?  Dispense:  4 capsule  ?  Refill:  0  ?  ? ?1. Umbilical hernia without obstruction and without gangrene ?I discussed with the patient strategies for staying on the plan post-surgically. Encouraged to continue exercise/activity per general surgeon. He will continue his prudent nutritional plan. ? ?2. Vitamin D deficiency ?We will refill prescription Vitamin D for 1 month. [name] will follow-up for routine testing of Vitamin D, at least 2-3 times per year to avoid over-replacement. ? ?- Vitamin D, Ergocalciferol, (DRISDOL) 1.25 MG (50000 UNIT) CAPS capsule; Take 1 capsule (50,000 Units total) by mouth every 7 (seven) days.  Dispense: 4 capsule; Refill: 0 ? ?3. Essential hypertension ?Ryver's blood pressure is at goal today, stable. He will continue his medications, healthy weight loss and exercise to improve blood pressure control. We will watch for signs of hypotension as he continues his lifestyle modifications. ? ?4. Low testosterone ?[name] will continue treatment and monitoring of CBC per his PCP's recommendations. ? ?5. At risk for impaired function of liver ?approximately 9 minutes of counseling today regarding prevention of impaired liver function. Alba was educated about his risk of developing an impaired of the liver. We recommend weight loss and also I recommend pt cut back on his etoh intake to 1 drink/d only   ? ?6. Obesity with current BMI of 28.1 ?[name] is currently in the action stage of change. As such, his goal is to continue with weight loss efforts. He  has agreed to the Category 3 Plan.  ? ?We will recheck fasting labs at his next office visit. ? ?Exercise goals: As is, post surgically per his surgeon. ? ?Behavioral modification strategies: increasing lean protein intake, increasing water intake, and better snacking choices. ? ?[name] has agreed to follow-up with our clinic in 4 weeks. He was informed of the importance of frequent follow-up visits  to maximize his success with intensive lifestyle modifications for his multiple health conditions.  ? ? ?Objective:  ? ?Blood pressure 121/76, pulse 66, temperature 98.2 ?F (36.8 ?C), height 5\' 9"  (1.753 m), weight 190 lb (86.2 kg), SpO2 98 %. ?Body mass index is 28.06 kg/m?. ? ?General: Cooperative, alert, well developed, in no acute distress. ?HEENT: Conjunctivae and lids unremarkable. ?Cardiovascular: Regular rhythm.  ?Lungs: Normal work of breathing. ?Neurologic: No focal deficits.  ? ?Lab Results  ?Component Value Date  ? CREATININE 0.96 02/25/2021  ? BUN 17 02/25/2021  ? NA 138 02/25/2021  ? K 4.5 02/25/2021  ? CL 97 02/25/2021  ? CO2 25 02/25/2021  ? ?Lab Results  ?Component Value Date  ? ALT 44 02/25/2021  ? AST 29 02/25/2021  ? ALKPHOS 64 02/25/2021  ? BILITOT 0.6 02/25/2021  ? ?Lab Results  ?Component Value Date  ? HGBA1C 5.5 06/16/2021  ? HGBA1C 6.1 (H) 02/25/2021  ? ?Lab Results  ?Component Value Date  ? INSULIN 12.2 06/16/2021  ? INSULIN 23.8 02/25/2021  ? ?Lab Results  ?Component Value Date  ? TSH 3.320 02/25/2021  ? ?Lab Results  ?Component Value Date  ? CHOL 139 06/16/2021  ? HDL 41 06/16/2021  ? LDLCALC 80 06/16/2021  ? TRIG 96 06/16/2021  ? CHOLHDL 3.9 02/25/2021  ? ?Lab Results  ?Component Value Date  ? VD25OH 77.3 06/16/2021  ? VD25OH 25.7 (L) 02/25/2021  ? ?Lab Results  ?Component Value Date  ? WBC 6.0 06/16/2021  ? HGB 19.4 (H) 06/16/2021  ? HCT 58.7 (H) 06/16/2021  ? MCV 90 06/16/2021  ? PLT 222 06/16/2021  ? ?No results found for: IRON, TIBC, FERRITIN ? ?Attestation Statements:  ? ?Reviewed by clinician on day of visit: allergies, medications, problem list, medical history, surgical history, family history, social history, and previous encounter notes. ? ? ?I, 06/18/2021, am acting as transcriptionist for [name] Knack, DO. ? ?I have reviewed the above documentation for accuracy and completeness, and I agree with the above. [employer], D.O. ? ?The 21st Century Cures Act was  signed into law in 2016 which includes the topic of electronic health records.  This provides immediate access to information in MyChart.  This includes consultation notes, operative notes, office notes, lab results and pathology reports.  If you have any questions about what you read please let 2017 know at your next visit so we can discuss your concerns and take corrective action if need be.  We are right here with you. ? ? ?

## 2021-12-09 DIAGNOSIS — K429 Umbilical hernia without obstruction or gangrene: Secondary | ICD-10-CM | POA: Diagnosis not present

## 2021-12-20 DIAGNOSIS — R052 Subacute cough: Secondary | ICD-10-CM | POA: Diagnosis not present

## 2021-12-20 DIAGNOSIS — J029 Acute pharyngitis, unspecified: Secondary | ICD-10-CM | POA: Diagnosis not present

## 2021-12-25 ENCOUNTER — Other Ambulatory Visit (INDEPENDENT_AMBULATORY_CARE_PROVIDER_SITE_OTHER): Payer: Self-pay | Admitting: Family Medicine

## 2021-12-25 DIAGNOSIS — E559 Vitamin D deficiency, unspecified: Secondary | ICD-10-CM

## 2021-12-30 ENCOUNTER — Ambulatory Visit (INDEPENDENT_AMBULATORY_CARE_PROVIDER_SITE_OTHER): Payer: BC Managed Care – PPO | Admitting: Family Medicine

## 2021-12-30 ENCOUNTER — Other Ambulatory Visit (INDEPENDENT_AMBULATORY_CARE_PROVIDER_SITE_OTHER): Payer: Self-pay | Admitting: Family Medicine

## 2021-12-30 ENCOUNTER — Encounter (INDEPENDENT_AMBULATORY_CARE_PROVIDER_SITE_OTHER): Payer: Self-pay | Admitting: Family Medicine

## 2021-12-30 VITALS — BP 124/82 | HR 79 | Temp 98.2°F | Ht 69.0 in | Wt 191.0 lb

## 2021-12-30 DIAGNOSIS — K760 Fatty (change of) liver, not elsewhere classified: Secondary | ICD-10-CM

## 2021-12-30 DIAGNOSIS — E559 Vitamin D deficiency, unspecified: Secondary | ICD-10-CM

## 2021-12-30 DIAGNOSIS — E038 Other specified hypothyroidism: Secondary | ICD-10-CM

## 2021-12-30 DIAGNOSIS — J302 Other seasonal allergic rhinitis: Secondary | ICD-10-CM

## 2021-12-30 DIAGNOSIS — Z9189 Other specified personal risk factors, not elsewhere classified: Secondary | ICD-10-CM

## 2021-12-30 DIAGNOSIS — I10 Essential (primary) hypertension: Secondary | ICD-10-CM

## 2021-12-30 DIAGNOSIS — Z6828 Body mass index (BMI) 28.0-28.9, adult: Secondary | ICD-10-CM

## 2021-12-30 DIAGNOSIS — E669 Obesity, unspecified: Secondary | ICD-10-CM

## 2021-12-30 DIAGNOSIS — E7849 Other hyperlipidemia: Secondary | ICD-10-CM | POA: Diagnosis not present

## 2021-12-30 DIAGNOSIS — R7303 Prediabetes: Secondary | ICD-10-CM

## 2021-12-30 MED ORDER — VITAMIN D (ERGOCALCIFEROL) 1.25 MG (50000 UNIT) PO CAPS: 50000.0000 [IU] | ORAL_CAPSULE | ORAL | 0 refills | Status: DC

## 2021-12-30 MED ORDER — MONTELUKAST SODIUM 10 MG PO TABS: 10.0000 mg | ORAL_TABLET | Freq: Every day | ORAL | 0 refills | Status: DC

## 2021-12-30 MED ORDER — FLUTICASONE PROPIONATE 50 MCG/ACT NA SUSP: NASAL | 0 refills | Status: DC

## 2021-12-30 MED ORDER — LEVOCETIRIZINE DIHYDROCHLORIDE 5 MG PO TABS: 5.0000 mg | ORAL_TABLET | Freq: Every evening | ORAL | 0 refills | Status: DC

## 2021-12-31 ENCOUNTER — Other Ambulatory Visit (INDEPENDENT_AMBULATORY_CARE_PROVIDER_SITE_OTHER): Payer: Self-pay | Admitting: Family Medicine

## 2022-01-05 NOTE — Progress Notes (Signed)
? ? ? ?Chief Complaint:  ? ?OBESITY ?Brett Wilcox is here to discuss his progress with his obesity treatment plan along with follow-up of his obesity related diagnoses. Brett Wilcox is on the Category 3 Plan and states he is following his eating plan approximately 85% of the time. Brett Wilcox states he is walking for 45 minutes 3 times per week. ? ?Today's visit was #: 15 ?Starting weight: 226 lbs ?Starting date: 02/25/2021 ?Today's weight: 191 lbs ?Today's date: 12/30/2021 ?Total lbs lost to date: 69 ?Total lbs lost since last in-office visit: 0 ? ?Interim History: Brett Wilcox had hernia surgery since last office visit. He increased his muscle mass and maintained fat mass from at his last office visit. Allergy symptoms have been bad and asking for advice. Issues with food/meal plan.  ? ?Subjective:  ? ?1. Pre-diabetes ?Brett Wilcox denies hunger or cravings, and he is not on medications.  ? ?2. Vitamin D deficiency ?Brett Wilcox is currently taking prescription vitamin D 50,000 IU each week. He denies nausea, vomiting or muscle weakness. ? ?3. Other hyperlipidemia ?Brett Wilcox is taking Crestor, and is tolerating medication(s) well without side effects.  Medication compliance is good as patient endorses taking it as prescribed.  The patient denies additional concerns regarding this condition.     ?  ?4. Other specified hypothyroidism ?Brett Wilcox is on Synthroid 75 mcg. Brett Wilcox is tolerating medication(s) well without side effects.  Medication compliance is good as patient endorses taking it as prescribed. The patient denies additional concerns regarding this condition. Energy is great.    ? ?5. Seasonal allergies ?Brett Wilcox notes increased sinus pressure, runny nose, and post nasal drip as well as headache that is worsening. Has issues with allergies, taking Claritin and Flonase daily.  ? ?6. At risk for impaired metabolic function ?Brett Wilcox is at increased risk for impaired metabolic function due to pre-diabetes.  ? ?Assessment/Plan:   ? ?Orders Placed This Encounter  ?Procedures  ? Comprehensive metabolic panel  ? Hemoglobin A1c  ? Insulin, random  ? Lipid Panel With LDL/HDL Ratio  ? VITAMIN D 25 Hydroxy (Vit-D Deficiency, Fractures)  ? Vitamin B12  ? TSH  ? T4, free  ? ? ?Medications Discontinued During This Encounter  ?Medication Reason  ? fluticasone (FLONASE) 50 MCG/ACT nasal spray Reorder  ? Vitamin D, Ergocalciferol, (DRISDOL) 1.25 MG (50000 UNIT) CAPS capsule Reorder  ? Vitamin D, Ergocalciferol, (DRISDOL) 1.25 MG (50000 UNIT) CAPS capsule   ?  ? ?Meds ordered this encounter  ?Medications  ? DISCONTD: Vitamin D, Ergocalciferol, (DRISDOL) 1.25 MG (50000 UNIT) CAPS capsule  ?  Sig: Take 1 capsule (50,000 Units total) by mouth every 7 (seven) days.  ?  Dispense:  4 capsule  ?  Refill:  0  ? montelukast (SINGULAIR) 10 MG tablet  ?  Sig: Take 1 tablet (10 mg total) by mouth at bedtime.  ?  Dispense:  30 tablet  ?  Refill:  0  ? levocetirizine (XYZAL) 5 MG tablet  ?  Sig: Take 1 tablet (5 mg total) by mouth every evening.  ?  Dispense:  30 tablet  ?  Refill:  0  ? fluticasone (FLONASE) 50 MCG/ACT nasal spray  ?  Sig: 1 spray each nostril BID after sinus rinse  ?  Dispense:  1 mL  ?  Refill:  0  ? Vitamin D, Ergocalciferol, (DRISDOL) 1.25 MG (50000 UNIT) CAPS capsule  ?  Sig: Take 1 capsule (50,000 Units total) by mouth every 7 (seven) days.  ?  Dispense:  4 capsule  ?  Refill:  0  ?  ? ?1. Pre-diabetes ?We will check labs at his next office visit. Follow his prudent nutritional plan, increase exercise, when cleared and continue weight loss.  ? ?- Hemoglobin A1c ?- Insulin, random ?- Vitamin B12 ? ?2. Vitamin D deficiency ?We will refill prescription Vitamin D for 1 month. Patient  requested a printed script. He will follow-up for routine testing of Vitamin D, at least 2-3 times per year to avoid over-replacement. ? ?- Vitamin D, Ergocalciferol, (DRISDOL) 1.25 MG (50000 UNIT) CAPS capsule; Take 1 capsule (50,000 Units total) by mouth every 7  (seven) days.  Dispense: 4 capsule; Refill: 0 ?- VITAMIN D 25 Hydroxy (Vit-D Deficiency, Fractures) ? ?3. Other hyperlipidemia ?We will check labs at [facility] next office visit. He will continue his medications and prudent nutritional plan. ? ?- Comprehensive metabolic panel ?- Lipid Panel With LDL/HDL Ratio ? ?4. Other specified hypothyroidism ?We will check labs at his next office visit. Brett Wilcox will continue his medications as written.  ? ?- TSH ?- T4, free ? ?5. Seasonal allergies ?Brett Wilcox agreed to start Singulair 10 mg with no refills, and discontinue Claritin. He  agreed to start Xyzal 5 mg with no refills. Sinus rinses BID and will follow up. ?  ?- montelukast (SINGULAIR) 10 MG tablet; Take 1 tablet (10 mg total) by mouth at bedtime.  Dispense: 30 tablet; Refill: 0 ?- levocetirizine (XYZAL) 5 MG tablet; Take 1 tablet (5 mg total) by mouth every evening.  Dispense: 30 tablet; Refill: 0 ?- fluticasone (FLONASE) 50 MCG/ACT nasal spray; 1 spray each nostril BID after sinus rinse  Dispense: 1 mL; Refill: 0 ? ?6. At risk for impaired metabolic function ?Brett Wilcox was given approximately 9 minutes of impaired  metabolic function prevention counseling today. We discussed intensive lifestyle modifications today with an emphasis on specific nutrition and exercise instructions and strategies.  ? ?Repetitive spaced learning was employed today to elicit superior memory formation and behavioral change. ? ?7. Obesity with current BMI of 28.3 ?Brett Wilcox is currently in the action stage of change. As such, his goal is to continue with weight loss efforts. He has agreed to the Category 3 Plan.  ? ?Patient not fasting today to obtain labs. Come fasting for blood work and IC to his next office visit. ? ?Exercise goals: As is, once cleared.  ? ?Behavioral modification strategies: increasing water intake and better snacking choices. ? ?Brett Wilcox has agreed to follow-up with our clinic in 4 weeks. He was informed of the importance of  frequent follow-up visits to maximize his success with intensive lifestyle modifications for his multiple health conditions.  ? ?Objective:  ? ?Blood pressure 124/82, pulse 79, temperature 98.2 ?F (36.8 ?C), height 5\' 9"  (1.753 m), weight 191 lb (86.6 kg), SpO2 95 %. ?Body mass index is 28.21 kg/m?. ? ?General: Cooperative, alert, well developed, in no acute distress. ?HEENT: Conjunctivae and lids unremarkable. ?Cardiovascular: Regular rhythm.  ?Lungs: Normal work of breathing. ?Neurologic: No focal deficits.  ? ?Lab Results  ?Component Value Date  ? CREATININE 0.96 02/25/2021  ? BUN 17 02/25/2021  ? NA 138 02/25/2021  ? K 4.5 02/25/2021  ? CL 97 02/25/2021  ? CO2 25 02/25/2021  ? ?Lab Results  ?Component Value Date  ? ALT 44 02/25/2021  ? AST 29 02/25/2021  ? ALKPHOS 64 02/25/2021  ? BILITOT 0.6 02/25/2021  ? ?Lab Results  ?Component Value Date  ? HGBA1C 5.5 06/16/2021  ? HGBA1C  6.1 (H) 02/25/2021  ? ?Lab Results  ?Component Value Date  ? INSULIN 12.2 06/16/2021  ? INSULIN 23.8 02/25/2021  ? ?Lab Results  ?Component Value Date  ? TSH 3.320 02/25/2021  ? ?Lab Results  ?Component Value Date  ? CHOL 139 06/16/2021  ? HDL 41 06/16/2021  ? [place] 80 06/16/2021  ? TRIG 96 06/16/2021  ? CHOLHDL 3.9 02/25/2021  ? ?Lab Results  ?Component Value Date  ? VD25OH 77.3 06/16/2021  ? VD25OH 25.7 (L) 02/25/2021  ? ?Lab Results  ?Component Value Date  ? WBC 6.0 06/16/2021  ? HGB 19.4 (H) 06/16/2021  ? HCT 58.7 (H) 06/16/2021  ? MCV 90 06/16/2021  ? PLT 222 06/16/2021  ? ?No results found for: IRON, TIBC, FERRITIN ? ?Attestation Statements:  ? ?Reviewed by clinician on day of visit: allergies, medications, problem list, medical history, surgical history, family history, social history, and previous encounter notes. ? ? ?I, Trixie Dredge, am acting as transcriptionist for [employer], DO. ? ?I have reviewed the above documentation for accuracy and completeness, and I agree with the above. Marjory Sneddon, D.O. ? ?The [place] was signed into law in 2016 which includes the topic of electronic health records.  This provides immediate access to information in MyChart.  This includes consultation notes, operative notes, office

## 2022-01-06 DIAGNOSIS — E291 Testicular hypofunction: Secondary | ICD-10-CM | POA: Diagnosis not present

## 2022-01-26 ENCOUNTER — Other Ambulatory Visit (INDEPENDENT_AMBULATORY_CARE_PROVIDER_SITE_OTHER): Payer: Self-pay | Admitting: Family Medicine

## 2022-01-26 DIAGNOSIS — E559 Vitamin D deficiency, unspecified: Secondary | ICD-10-CM

## 2022-01-27 ENCOUNTER — Encounter (INDEPENDENT_AMBULATORY_CARE_PROVIDER_SITE_OTHER): Payer: Self-pay | Admitting: Family Medicine

## 2022-01-27 ENCOUNTER — Ambulatory Visit (INDEPENDENT_AMBULATORY_CARE_PROVIDER_SITE_OTHER): Payer: BC Managed Care – PPO | Admitting: Family Medicine

## 2022-01-27 ENCOUNTER — Other Ambulatory Visit (INDEPENDENT_AMBULATORY_CARE_PROVIDER_SITE_OTHER): Payer: Self-pay | Admitting: Family Medicine

## 2022-01-27 VITALS — BP 110/77 | HR 68 | Temp 97.8°F | Ht 69.0 in | Wt 197.0 lb

## 2022-01-27 DIAGNOSIS — Z9189 Other specified personal risk factors, not elsewhere classified: Secondary | ICD-10-CM

## 2022-01-27 DIAGNOSIS — Z6829 Body mass index (BMI) 29.0-29.9, adult: Secondary | ICD-10-CM

## 2022-01-27 DIAGNOSIS — E559 Vitamin D deficiency, unspecified: Secondary | ICD-10-CM

## 2022-01-27 DIAGNOSIS — E669 Obesity, unspecified: Secondary | ICD-10-CM

## 2022-01-27 DIAGNOSIS — E7849 Other hyperlipidemia: Secondary | ICD-10-CM | POA: Diagnosis not present

## 2022-01-27 DIAGNOSIS — J302 Other seasonal allergic rhinitis: Secondary | ICD-10-CM | POA: Diagnosis not present

## 2022-01-27 DIAGNOSIS — E038 Other specified hypothyroidism: Secondary | ICD-10-CM | POA: Diagnosis not present

## 2022-01-27 DIAGNOSIS — R7303 Prediabetes: Secondary | ICD-10-CM

## 2022-01-27 MED ORDER — VITAMIN D (ERGOCALCIFEROL) 1.25 MG (50000 UNIT) PO CAPS: 50000.0000 [IU] | ORAL_CAPSULE | ORAL | 0 refills | Status: DC

## 2022-01-27 MED ORDER — LEVOCETIRIZINE DIHYDROCHLORIDE 5 MG PO TABS: 5.0000 mg | ORAL_TABLET | Freq: Every evening | ORAL | 0 refills | Status: DC

## 2022-01-27 MED ORDER — MONTELUKAST SODIUM 10 MG PO TABS: 10.0000 mg | ORAL_TABLET | Freq: Every day | ORAL | 0 refills | Status: DC

## 2022-01-27 MED ORDER — METFORMIN HCL 500 MG PO TABS
ORAL_TABLET | ORAL | 0 refills | Status: DC
Start: 2022-01-27 — End: 2022-02-21

## 2022-01-28 LAB — COMPREHENSIVE METABOLIC PANEL
ALT: 37 IU/L (ref 0–44)
AST: 25 IU/L (ref 0–40)
Albumin/Globulin Ratio: 2.3 — ABNORMAL HIGH (ref 1.2–2.2)
Albumin: 5 g/dL — ABNORMAL HIGH (ref 3.8–4.9)
Alkaline Phosphatase: 59 IU/L (ref 44–121)
BUN/Creatinine Ratio: 22 — ABNORMAL HIGH (ref 9–20)
BUN: 17 mg/dL (ref 6–24)
Bilirubin Total: 0.5 mg/dL (ref 0.0–1.2)
CO2: 26 mmol/L (ref 20–29)
Calcium: 9.7 mg/dL (ref 8.7–10.2)
Chloride: 99 mmol/L (ref 96–106)
Creatinine, Ser: 0.76 mg/dL (ref 0.76–1.27)
Globulin, Total: 2.2 g/dL (ref 1.5–4.5)
Glucose: 100 mg/dL — ABNORMAL HIGH (ref 70–99)
Potassium: 4.5 mmol/L (ref 3.5–5.2)
Sodium: 139 mmol/L (ref 134–144)
Total Protein: 7.2 g/dL (ref 6.0–8.5)
eGFR: 107 mL/min/{1.73_m2} (ref >59)

## 2022-01-28 LAB — LIPID PANEL WITH LDL/HDL RATIO
Cholesterol, Total: 172 mg/dL (ref 100–199)
HDL: 54 mg/dL (ref >39)
LDL Chol Calc (NIH): 99 mg/dL (ref 0–99)
LDL/HDL Ratio: 1.8 ratio (ref 0.0–3.6)
Triglycerides: 105 mg/dL (ref 0–149)
VLDL Cholesterol Cal: 19 mg/dL (ref 5–40)

## 2022-01-28 LAB — HEMOGLOBIN A1C
Est. average glucose Bld gHb Est-mCnc: 108 mg/dL
Hgb A1c MFr Bld: 5.4 % (ref 4.8–5.6)

## 2022-01-28 LAB — VITAMIN B12: Vitamin B-12: 575 pg/mL (ref 232–1245)

## 2022-01-28 LAB — VITAMIN D 25 HYDROXY (VIT D DEFICIENCY, FRACTURES): Vit D, 25-Hydroxy: 63.8 ng/mL (ref 30.0–100.0)

## 2022-01-28 LAB — T4, FREE: Free T4: 1.59 ng/dL (ref 0.82–1.77)

## 2022-01-28 LAB — TSH: TSH: 3.31 u[IU]/mL (ref 0.450–4.500)

## 2022-01-28 LAB — INSULIN, RANDOM: INSULIN: 13.6 u[IU]/mL (ref 2.6–24.9)

## 2022-02-07 DIAGNOSIS — E039 Hypothyroidism, unspecified: Secondary | ICD-10-CM | POA: Diagnosis not present

## 2022-02-07 DIAGNOSIS — J309 Allergic rhinitis, unspecified: Secondary | ICD-10-CM | POA: Diagnosis not present

## 2022-02-07 DIAGNOSIS — E8881 Metabolic syndrome: Secondary | ICD-10-CM | POA: Diagnosis not present

## 2022-02-07 DIAGNOSIS — K219 Gastro-esophageal reflux disease without esophagitis: Secondary | ICD-10-CM | POA: Diagnosis not present

## 2022-02-14 NOTE — Progress Notes (Signed)
? ? ? ?Chief Complaint:  ? ?OBESITY ?Johnedward is here to discuss his progress with his obesity treatment plan along with follow-up of his obesity related diagnoses. Romeo is on the Category 3 Plan and states he is following his eating plan approximately 80% of the time. Orel states he is doing pushups, situps, and playing tennis for 50 minutes-2 hours 2-5 times per week. ? ?Today's visit was #: 16 ?Starting weight: 226 lbs ?Starting date: 02/25/2021 ?Today's weight: 197 lbs ?Today's date: 01/27/2022 ?Total lbs lost to date: 14 ?Total lbs lost since last in-office visit: 0 ? ?Interim History: Girard has had more snacking lately (ie, mid-day). Hunger and has a bowl of cereal. Here for repeat IC and labs today.  ? ?Subjective:  ? ?1. Pre-diabetes ?More carb cravings lately and hunger. ? ?2. Vitamin D deficiency ?He is currently taking prescription vitamin D 50,000 IU each week. He denies nausea, vomiting or muscle weakness. ? ?3. Seasonal allergies ?Medications working great. No allergy symptoms now. ? ?4. At risk for adverse drug reaction ?Stacie is at risk for drug side effects due to started new medication. ? ?Assessment/Plan:  ?No orders of the defined types were placed in this encounter. ? ? ?Medications Discontinued During This Encounter  ?Medication Reason  ? montelukast (SINGULAIR) 10 MG tablet Reorder  ? levocetirizine (XYZAL) 5 MG tablet Reorder  ? Vitamin D, Ergocalciferol, (DRISDOL) 1.25 MG (50000 UNIT) CAPS capsule Reorder  ?  ? ?Meds ordered this encounter  ?Medications  ? Vitamin D, Ergocalciferol, (DRISDOL) 1.25 MG (50000 UNIT) CAPS capsule  ?  Sig: Take 1 capsule (50,000 Units total) by mouth every 7 (seven) days.  ?  Dispense:  4 capsule  ?  Refill:  0  ? levocetirizine (XYZAL) 5 MG tablet  ?  Sig: Take 1 tablet (5 mg total) by mouth every evening.  ?  Dispense:  30 tablet  ?  Refill:  0  ? montelukast (SINGULAIR) 10 MG tablet  ?  Sig: Take 1 tablet (10 mg total) by mouth at bedtime.  ?  Dispense:  30  tablet  ?  Refill:  0  ? metFORMIN (GLUCOPHAGE) 500 MG tablet  ?  Sig: 1/2 po with lunch daily  ?  Dispense:  30 tablet  ?  Refill:  0  ?  30 d supply;  ** OV for RF **   Do not send RF request  ?  ? ?1. Pre-diabetes ?Start metformin 250 mg with lunch daily. May increase to 1 tablet PO with lunch in 4-6 days if tolerating well. Handout on metformin was given. ? ?- metFORMIN (GLUCOPHAGE) 500 MG tablet; 1/2 po with lunch daily  Dispense: 30 tablet; Refill: 0 ? ?2. Vitamin D deficiency ?We will refill prescription Vitamin D 50,000 IU every week for 1 month. Jahziah will follow-up for routine testing of Vitamin D, at least 2-3 times per year to avoid over-replacement. ? ?- Vitamin D, Ergocalciferol, (DRISDOL) 1.25 MG (50000 UNIT) CAPS capsule; Take 1 capsule (50,000 Units total) by mouth every 7 (seven) days.  Dispense: 4 capsule; Refill: 0 ? ?3. Seasonal allergies ?We will refill Singulair and Xyzal for 1 month. Continue medications until season is over (mid to early Summer).  ? ?- levocetirizine (XYZAL) 5 MG tablet; Take 1 tablet (5 mg total) by mouth every evening.  Dispense: 30 tablet; Refill: 0 ?- montelukast (SINGULAIR) 10 MG tablet; Take 1 tablet (10 mg total) by mouth at bedtime.  Dispense: 30 tablet; Refill: 0 ? ?  4. At risk for adverse drug reaction ?Amiri was given approximately 9 minutes of drug side effect counseling today. We discussed side effect possibility and risk versus benefits. Marvel agreed to the medication and will contact this office if these side effects are intolerable.  ? ?Repetitive spaced learning was employed today to elicit superior memory formation and behavioral change. ? ?5. Obesity with current BMI of 29.1 ?Taevion is currently in the action stage of change. As such, his goal is to continue with weight loss efforts. He has agreed to the Category 3 Plan.  ? ?Getting labs today. Repeat REE done today. Essentially without change 2191, now 2146 Great! ? ?Exercise goals: As is. ? ?Behavioral  modification strategies: increasing lean protein intake, decreasing simple carbohydrates, and planning for success. ? ?Yates has agreed to follow-up with our clinic in 3 to 4 weeks. He was informed of the importance of frequent follow-up visits to maximize his success with intensive lifestyle modifications for his multiple health conditions.  ? ?Objective:  ? ?Blood pressure 110/77, pulse 68, temperature 97.8 ?F (36.6 ?C), height 5\' 9"  (1.753 m), weight 197 lb (89.4 kg), SpO2 97 %. ?Body mass index is 29.09 kg/m?. ? ?General: Cooperative, alert, well developed, in no acute distress. ?HEENT: Conjunctivae and lids unremarkable. ?Cardiovascular: Regular rhythm.  ?Lungs: Normal work of breathing. ?Neurologic: No focal deficits.  ? ?Lab Results  ?Component Value Date  ? CREATININE 0.76 01/27/2022  ? BUN 17 01/27/2022  ? NA 139 01/27/2022  ? K 4.5 01/27/2022  ? CL 99 01/27/2022  ? CO2 26 01/27/2022  ? ?Lab Results  ?Component Value Date  ? ALT 37 01/27/2022  ? AST 25 01/27/2022  ? ALKPHOS 59 01/27/2022  ? BILITOT 0.5 01/27/2022  ? ?Lab Results  ?Component Value Date  ? HGBA1C 5.4 01/27/2022  ? HGBA1C 5.5 06/16/2021  ? HGBA1C 6.1 (H) 02/25/2021  ? ?Lab Results  ?Component Value Date  ? INSULIN 13.6 01/27/2022  ? INSULIN 12.2 06/16/2021  ? INSULIN 23.8 02/25/2021  ? ?Lab Results  ?Component Value Date  ? TSH 3.310 01/27/2022  ? ?Lab Results  ?Component Value Date  ? CHOL 172 01/27/2022  ? HDL 54 01/27/2022  ? LDLCALC 99 01/27/2022  ? TRIG 105 01/27/2022  ? CHOLHDL 3.9 02/25/2021  ? ?Lab Results  ?Component Value Date  ? VD25OH 63.8 01/27/2022  ? VD25OH 77.3 06/16/2021  ? VD25OH 25.7 (L) 02/25/2021  ? ?Lab Results  ?Component Value Date  ? WBC 6.0 06/16/2021  ? HGB 19.4 (H) 06/16/2021  ? HCT 58.7 (H) 06/16/2021  ? MCV 90 06/16/2021  ? PLT 222 06/16/2021  ? ?No results found for: IRON, TIBC, FERRITIN ? ?Attestation Statements:  ? ?Reviewed by clinician on day of visit: allergies, medications, problem list, medical history,  surgical history, family history, social history, and previous encounter notes. ? ? ?I, 06/18/2021, am acting as transcriptionist for [name] Knack, DO. ? ?I have reviewed the above documentation for accuracy and completeness, and I agree with the above. [employer], D.O. ? ?The 21st Century Cures Act was signed into law in 2016 which includes the topic of electronic health records.  This provides immediate access to information in MyChart.  This includes consultation notes, operative notes, office notes, lab results and pathology reports.  If you have any questions about what you read please let 2017 know at your next visit so we can discuss your concerns and take corrective action if need be.  We  are right here with you. ? ? ?

## 2022-02-21 ENCOUNTER — Other Ambulatory Visit (INDEPENDENT_AMBULATORY_CARE_PROVIDER_SITE_OTHER): Payer: Self-pay | Admitting: Family Medicine

## 2022-02-21 ENCOUNTER — Ambulatory Visit (INDEPENDENT_AMBULATORY_CARE_PROVIDER_SITE_OTHER): Payer: BC Managed Care – PPO | Admitting: Family Medicine

## 2022-02-21 VITALS — BP 132/78 | HR 69 | Temp 97.6°F | Ht 69.0 in | Wt 196.0 lb

## 2022-02-21 DIAGNOSIS — J302 Other seasonal allergic rhinitis: Secondary | ICD-10-CM

## 2022-02-21 DIAGNOSIS — E038 Other specified hypothyroidism: Secondary | ICD-10-CM

## 2022-02-21 DIAGNOSIS — Z6829 Body mass index (BMI) 29.0-29.9, adult: Secondary | ICD-10-CM

## 2022-02-21 DIAGNOSIS — E669 Obesity, unspecified: Secondary | ICD-10-CM

## 2022-02-21 DIAGNOSIS — I10 Essential (primary) hypertension: Secondary | ICD-10-CM | POA: Diagnosis not present

## 2022-02-21 DIAGNOSIS — E559 Vitamin D deficiency, unspecified: Secondary | ICD-10-CM

## 2022-02-21 DIAGNOSIS — E7849 Other hyperlipidemia: Secondary | ICD-10-CM

## 2022-02-21 DIAGNOSIS — R7303 Prediabetes: Secondary | ICD-10-CM | POA: Diagnosis not present

## 2022-02-21 DIAGNOSIS — Z9189 Other specified personal risk factors, not elsewhere classified: Secondary | ICD-10-CM

## 2022-02-21 DIAGNOSIS — Z7984 Long term (current) use of oral hypoglycemic drugs: Secondary | ICD-10-CM

## 2022-02-21 MED ORDER — VITAMIN D (ERGOCALCIFEROL) 1.25 MG (50000 UNIT) PO CAPS: 50000.0000 [IU] | ORAL_CAPSULE | ORAL | 0 refills | Status: DC

## 2022-02-21 MED ORDER — MONTELUKAST SODIUM 10 MG PO TABS: 10.0000 mg | ORAL_TABLET | Freq: Every day | ORAL | 0 refills | Status: DC

## 2022-02-21 MED ORDER — METFORMIN HCL 500 MG PO TABS: ORAL_TABLET | ORAL | 0 refills | Status: DC

## 2022-02-21 MED ORDER — LEVOCETIRIZINE DIHYDROCHLORIDE 5 MG PO TABS: 5.0000 mg | ORAL_TABLET | Freq: Every evening | ORAL | 0 refills | Status: DC

## 2022-02-22 ENCOUNTER — Encounter (INDEPENDENT_AMBULATORY_CARE_PROVIDER_SITE_OTHER): Payer: Self-pay | Admitting: Family Medicine

## 2022-03-03 NOTE — Progress Notes (Signed)
Chief Complaint:   OBESITY Brett Wilcox is here to discuss his progress with his obesity treatment plan along with follow-up of his obesity related diagnoses. Brett Wilcox is on the Category 3 Plan and states he is following his eating plan approximately 85% of the time. Brett Wilcox states he is walking, playing tennis, in the swimming pool, and doing sit ups  45 minutes 3 times per week.  Today's visit was #: 17 Starting weight: 226 lbs Starting date: 02/25/2021 Today's weight: 196 lbs Today's date: 02/21/2022 Total lbs lost to date: 30 Total lbs lost since last in-office visit: 1 lb  Interim History: Has not been weighing protein or measuring snack calories.  At night Brett Wilcox will have a couple of bourbons, a handful of nuts, some crackers and laughing cow wedge at happy hour prior to dinner.  Brett Wilcox denies any issues with meal plan.  Nails breakfast and lunch daily.   Subjective:   1. Essential hypertension Brett Wilcox is on HCTZ. Review: taking medications as instructed, no medication side effects noted, no chest pain on exertion, no dyspnea on exertion, no swelling of ankles. Discussed labs today with patient.  BP Readings from Last 3 Encounters:  02/21/22 132/78  01/27/22 110/77  12/30/21 124/82     2. Pre-diabetes Discussed labs with patient today. Brett Wilcox started Metformin last office visit, and has tolerated it well.  Not sure if it is helping with cravings or not. His other Doctor agrees with the medication management.  3. Other hyperlipidemia Discussed labs with patient today. Brett Wilcox denies myalgias, arthralgias.  Medication management per PCP. Brett Wilcox is taking Crestor 10 mg.  Brett Wilcox is on a low salt and trans fat diet.  4. Seasonal allergies Brett Wilcox's symptoms are well controlled on current regimen.  Denies any side effects.  Tolerated well.   5. Other specified hypothyroidism Discussed labs with patient today. Brett Wilcox denies any concerns or complaints.   Brett Wilcox is on Synthroid 75  mcg. Brett Wilcox is tolerating medication(s) well without side effects.  Medication compliance is good as patient endorses taking it as prescribed.   6. Vitamin D deficiency Discussed labs with patient today. Brett Wilcox is tolerating medication(s) well without side effects.  Medication compliance is good as patient endorses taking it as prescribed.  The patient denies additional concerns regarding this condition.       7. At risk for diabetes mellitus Brett Wilcox is at higher than average risk for developing diabetes due to his obesity and pre-diabetes.   Assessment/Plan:  No orders of the defined types were placed in this encounter.   Medications Discontinued During This Encounter  Medication Reason   Vitamin D, Ergocalciferol, (DRISDOL) 1.25 MG (50000 UNIT) CAPS capsule Reorder   levocetirizine (XYZAL) 5 MG tablet Reorder   montelukast (SINGULAIR) 10 MG tablet Reorder   metFORMIN (GLUCOPHAGE) 500 MG tablet Reorder     Meds ordered this encounter  Medications   metFORMIN (GLUCOPHAGE) 500 MG tablet    Sig: 1 po with lunch daily    Dispense:  30 tablet    Refill:  0    30 d supply;  ** OV for RF **   Do not send RF request   montelukast (SINGULAIR) 10 MG tablet    Sig: Take 1 tablet (10 mg total) by mouth at bedtime.    Dispense:  30 tablet    Refill:  0   levocetirizine (XYZAL) 5 MG tablet    Sig: Take 1 tablet (5 mg total) by mouth every evening.  Dispense:  30 tablet    Refill:  0   Vitamin D, Ergocalciferol, (DRISDOL) 1.25 MG (50000 UNIT) CAPS capsule    Sig: Take 1 capsule (50,000 Units total) by mouth every 7 (seven) days.    Dispense:  4 capsule    Refill:  0     1. Essential hypertension CMP and blood pressure at goal.  Continue medications per PCP, increase water intake and decrease salt intake.  2. Pre-diabetes Refill Metformin today and increase protein, decrease simple carbohydrates.  We will recheck magnesium, Vitamin B12, BMP, and A1c in 3-4 months.  - metFORMIN  (GLUCOPHAGE) 500 MG tablet; 1 po with lunch daily  Dispense: 30 tablet; Refill: 0  3. Other hyperlipidemia Fasting lipid panel is a goal with increasing HDL, which is good.  Increase exercise to improve level to >60.  Continue medications per PCP and current prudent nutritional plan.   4. Seasonal allergies Refill Xyzal and Singulair, 1 month supply, no refills.  See below.  Continue preventative strategies as well.   - montelukast (SINGULAIR) 10 MG tablet; Take 1 tablet (10 mg total) by mouth at bedtime.  Dispense: 30 tablet; Refill: 0 - levocetirizine (XYZAL) 5 MG tablet; Take 1 tablet (5 mg total) by mouth every evening.  Dispense: 30 tablet; Refill: 0  5. Other specified hypothyroidism Brett Wilcox's thyroid panel is at goal.  Continue medications per PCP.  Patient with long-standing hypothyroidism, on levothyroxine therapy. He appears euthyroid. Orders and follow up as documented in patient record.  Counseling Good thyroid control is important for overall health. Supratherapeutic thyroid levels are dangerous and will not improve weight loss results. The correct way to take levothyroxine is fasting, with water, separated by at least 30 minutes from breakfast, and separated by more than 4 hours from calcium, iron, multivitamins, acid reflux medications (PPIs).    6. Vitamin D deficiency Low Vitamin D level contributes to fatigue and are associated with obesity, breast, and colon cancer. He agrees to continue to take prescription Vitamin D @50 ,000 IU every week and will follow-up for routine testing of Vitamin D, at least 2-3 times per year to avoid over-replacement.  Vitamin D at goal. Refill Ergocalciferol. See below.  - Vitamin D, Ergocalciferol, (DRISDOL) 1.25 MG (50000 UNIT) CAPS capsule; Take 1 capsule (50,000 Units total) by mouth every 7 (seven) days.  Dispense: 4 capsule; Refill: 0  7. At risk for diabetes mellitus Brett Wilcox was given approximately 24 minutes of diabetic education and  counseling today. We discussed intensive lifestyle modifications today with an emphasis on weight loss as well as increasing exercise and decreasing simple carbohydrates in his diet. We also reviewed medication options with an emphasis on risk versus benefits of those discussed.  Repetitive spaced learning was employed today to elicit superior memory formation and behavioral change.   8. Obesity with current BMI of 29.1 Increase protein at dinner time and measure snack calories.  We reviewed all labs today.   Brett Wilcox is currently in the action stage of change. As such, his goal is to continue with weight loss efforts. He has agreed to the Category 3 Plan.   Exercise goals:  As is.  Behavioral modification strategies: increasing water intake, decreasing alcohol intake, better snacking choices, and avoiding temptations.   Brett Wilcox has agreed to follow-up with our clinic in 4 weeks. He was informed of the importance of frequent follow-up visits to maximize his success with intensive lifestyle modifications for his multiple health conditions.   Objective:   Blood  pressure 132/78, pulse 69, temperature 97.6 F (36.4 C), height 5\' 9"  (1.753 m), weight 196 lb (88.9 kg), SpO2 96 %. Body mass index is 28.94 kg/m.  General: Cooperative, alert, well developed, in no acute distress. HEENT: Conjunctivae and lids unremarkable. Cardiovascular: Regular rhythm.  Lungs: Normal work of breathing. Neurologic: No focal deficits.   Lab Results  Component Value Date   CREATININE 0.76 01/27/2022   BUN 17 01/27/2022   NA 139 01/27/2022   K 4.5 01/27/2022   CL 99 01/27/2022   CO2 26 01/27/2022   Lab Results  Component Value Date   ALT 37 01/27/2022   AST 25 01/27/2022   ALKPHOS 59 01/27/2022   BILITOT 0.5 01/27/2022   Lab Results  Component Value Date   HGBA1C 5.4 01/27/2022   HGBA1C 5.5 06/16/2021   HGBA1C 6.1 (H) 02/25/2021   Lab Results  Component Value Date   INSULIN 13.6 01/27/2022    INSULIN 12.2 06/16/2021   INSULIN 23.8 02/25/2021   Lab Results  Component Value Date   TSH 3.310 01/27/2022   Lab Results  Component Value Date   CHOL 172 01/27/2022   HDL 54 01/27/2022   LDLCALC 99 01/27/2022   TRIG 105 01/27/2022   CHOLHDL 3.9 02/25/2021   Lab Results  Component Value Date   VD25OH 63.8 01/27/2022   VD25OH 77.3 06/16/2021   VD25OH 25.7 (L) 02/25/2021   Lab Results  Component Value Date   WBC 6.0 06/16/2021   HGB 19.4 (H) 06/16/2021   HCT 58.7 (H) 06/16/2021   MCV 90 06/16/2021   PLT 222 06/16/2021   No results found for: IRON, TIBC, FERRITIN  Attestation Statements:   Reviewed by clinician on day of visit: allergies, medications, problem list, medical history, surgical history, family history, social history, and previous encounter notes.  I, 06/18/2021, RMA, am acting as Malcolm Metro for Energy manager, DO.  I have reviewed the above documentation for accuracy and completeness, and I agree with the above. [employer], D.O.  The 21st Century Cures Act was signed into law in 2016 which includes the topic of electronic health records.  This provides immediate access to information in MyChart.  This includes consultation notes, operative notes, office notes, lab results and pathology reports.  If you have any questions about what you read please let 2017 know at your next visit so we can discuss your concerns and take corrective action if need be.  We are right here with you.

## 2022-03-21 ENCOUNTER — Ambulatory Visit (INDEPENDENT_AMBULATORY_CARE_PROVIDER_SITE_OTHER): Payer: BC Managed Care – PPO | Admitting: Family Medicine

## 2022-03-21 ENCOUNTER — Encounter (INDEPENDENT_AMBULATORY_CARE_PROVIDER_SITE_OTHER): Payer: Self-pay | Admitting: Family Medicine

## 2022-03-21 ENCOUNTER — Other Ambulatory Visit (INDEPENDENT_AMBULATORY_CARE_PROVIDER_SITE_OTHER): Payer: Self-pay | Admitting: Family Medicine

## 2022-03-21 VITALS — BP 126/74 | HR 63 | Temp 97.8°F | Ht 69.0 in | Wt 197.0 lb

## 2022-03-21 DIAGNOSIS — E669 Obesity, unspecified: Secondary | ICD-10-CM | POA: Diagnosis not present

## 2022-03-21 DIAGNOSIS — E559 Vitamin D deficiency, unspecified: Secondary | ICD-10-CM

## 2022-03-21 DIAGNOSIS — Z9189 Other specified personal risk factors, not elsewhere classified: Secondary | ICD-10-CM

## 2022-03-21 DIAGNOSIS — Z7984 Long term (current) use of oral hypoglycemic drugs: Secondary | ICD-10-CM

## 2022-03-21 DIAGNOSIS — R7303 Prediabetes: Secondary | ICD-10-CM | POA: Diagnosis not present

## 2022-03-21 DIAGNOSIS — J302 Other seasonal allergic rhinitis: Secondary | ICD-10-CM

## 2022-03-21 DIAGNOSIS — Z6829 Body mass index (BMI) 29.0-29.9, adult: Secondary | ICD-10-CM

## 2022-03-21 MED ORDER — MONTELUKAST SODIUM 10 MG PO TABS: 10.0000 mg | ORAL_TABLET | Freq: Every day | ORAL | 0 refills | Status: DC

## 2022-03-21 MED ORDER — METFORMIN HCL 500 MG PO TABS: ORAL_TABLET | ORAL | 0 refills | Status: DC

## 2022-03-21 MED ORDER — LEVOCETIRIZINE DIHYDROCHLORIDE 5 MG PO TABS: 5.0000 mg | ORAL_TABLET | Freq: Every evening | ORAL | 0 refills | Status: DC

## 2022-03-21 MED ORDER — VITAMIN D (ERGOCALCIFEROL) 1.25 MG (50000 UNIT) PO CAPS: 50000.0000 [IU] | ORAL_CAPSULE | ORAL | 0 refills | Status: DC

## 2022-03-23 ENCOUNTER — Other Ambulatory Visit (INDEPENDENT_AMBULATORY_CARE_PROVIDER_SITE_OTHER): Payer: Self-pay | Admitting: Family Medicine

## 2022-03-23 DIAGNOSIS — J302 Other seasonal allergic rhinitis: Secondary | ICD-10-CM

## 2022-03-23 DIAGNOSIS — J309 Allergic rhinitis, unspecified: Secondary | ICD-10-CM | POA: Diagnosis not present

## 2022-03-23 DIAGNOSIS — E669 Obesity, unspecified: Secondary | ICD-10-CM | POA: Diagnosis not present

## 2022-03-23 DIAGNOSIS — M1A00X0 Idiopathic chronic gout, unspecified site, without tophus (tophi): Secondary | ICD-10-CM | POA: Diagnosis not present

## 2022-03-23 DIAGNOSIS — E291 Testicular hypofunction: Secondary | ICD-10-CM | POA: Diagnosis not present

## 2022-03-28 ENCOUNTER — Other Ambulatory Visit (INDEPENDENT_AMBULATORY_CARE_PROVIDER_SITE_OTHER): Payer: Self-pay | Admitting: Family Medicine

## 2022-03-28 DIAGNOSIS — J302 Other seasonal allergic rhinitis: Secondary | ICD-10-CM

## 2022-04-14 ENCOUNTER — Other Ambulatory Visit (INDEPENDENT_AMBULATORY_CARE_PROVIDER_SITE_OTHER): Payer: Self-pay | Admitting: Family Medicine

## 2022-04-14 DIAGNOSIS — E559 Vitamin D deficiency, unspecified: Secondary | ICD-10-CM

## 2022-04-19 ENCOUNTER — Ambulatory Visit (INDEPENDENT_AMBULATORY_CARE_PROVIDER_SITE_OTHER): Payer: BC Managed Care – PPO | Admitting: Family Medicine

## 2022-04-19 ENCOUNTER — Other Ambulatory Visit (INDEPENDENT_AMBULATORY_CARE_PROVIDER_SITE_OTHER): Payer: Self-pay | Admitting: Family Medicine

## 2022-04-19 VITALS — BP 124/80 | HR 60 | Temp 97.6°F | Ht 69.0 in | Wt 196.0 lb

## 2022-04-19 DIAGNOSIS — Z6833 Body mass index (BMI) 33.0-33.9, adult: Secondary | ICD-10-CM

## 2022-04-19 DIAGNOSIS — E559 Vitamin D deficiency, unspecified: Secondary | ICD-10-CM | POA: Diagnosis not present

## 2022-04-19 DIAGNOSIS — R7303 Prediabetes: Secondary | ICD-10-CM

## 2022-04-19 DIAGNOSIS — Z6829 Body mass index (BMI) 29.0-29.9, adult: Secondary | ICD-10-CM

## 2022-04-19 DIAGNOSIS — E669 Obesity, unspecified: Secondary | ICD-10-CM

## 2022-04-19 DIAGNOSIS — J302 Other seasonal allergic rhinitis: Secondary | ICD-10-CM

## 2022-04-19 MED ORDER — LEVOCETIRIZINE DIHYDROCHLORIDE 5 MG PO TABS: 5.0000 mg | ORAL_TABLET | Freq: Every evening | ORAL | 0 refills | Status: DC

## 2022-04-19 MED ORDER — VITAMIN D (ERGOCALCIFEROL) 1.25 MG (50000 UNIT) PO CAPS: 50000.0000 [IU] | ORAL_CAPSULE | ORAL | 0 refills | Status: DC

## 2022-04-19 MED ORDER — MONTELUKAST SODIUM 10 MG PO TABS: 10.0000 mg | ORAL_TABLET | Freq: Every day | ORAL | 0 refills | Status: DC

## 2022-04-19 MED ORDER — METFORMIN HCL 500 MG PO TABS: ORAL_TABLET | ORAL | 0 refills | Status: DC

## 2022-04-20 ENCOUNTER — Encounter (INDEPENDENT_AMBULATORY_CARE_PROVIDER_SITE_OTHER): Payer: Self-pay | Admitting: Family Medicine

## 2022-04-24 ENCOUNTER — Other Ambulatory Visit (INDEPENDENT_AMBULATORY_CARE_PROVIDER_SITE_OTHER): Payer: Self-pay | Admitting: Family Medicine

## 2022-04-24 DIAGNOSIS — R7303 Prediabetes: Secondary | ICD-10-CM

## 2022-04-24 NOTE — Progress Notes (Signed)
Chief Complaint:   OBESITY Brett Wilcox is here to discuss his progress with his obesity treatment plan along with follow-up of his obesity related diagnoses. Brett Wilcox is on the Category 3 Plan and states he is following his eating plan approximately 70% of the time. Tahjir states he is doing push ups, sit ups, weights and tennis 35-90 minutes 2-3 times per week.  Today's visit was #: 19 Starting weight: 226 lbs Starting date: 02/25/2021 Today's weight: 196 lbs Today's date: 04/19/2022 Total lbs lost to date: 30 lbs Total lbs lost since last in-office visit: 1 lb  Interim History: Brett Wilcox has been working out still but has been having more celebration eating out as of lat.  Denies any hunger or cravings.  He is not sure if increased dose of metformin mad much of a difference.  Subjective:   1. Pre-diabetes Last office visit we increased metformin to BID with lunch and dinner.  He denies any side effects and not sure if it makes a difference.  A1c is at goal when it was checked 2 months ago.    2. Seasonal allergies Brett Wilcox is tolerating medication(s) well without side effects.  Medication compliance is good as patient endorses taking it as prescribed.  The patient denies additional concerns regarding this condition.      3. Vitamin D deficiency He is currently taking prescription vitamin D 50,000 IU each week. He denies nausea, vomiting or muscle weakness.  Assessment/Plan:  No orders of the defined types were placed in this encounter.   Medications Discontinued During This Encounter  Medication Reason   Vitamin D, Ergocalciferol, (DRISDOL) 1.25 MG (50000 UNIT) CAPS capsule Reorder   montelukast (SINGULAIR) 10 MG tablet Reorder   metFORMIN (GLUCOPHAGE) 500 MG tablet Reorder   levocetirizine (XYZAL) 5 MG tablet Reorder     Meds ordered this encounter  Medications   metFORMIN (GLUCOPHAGE) 500 MG tablet    Sig: 1 po with lunch daily and 1 po dinner daily    Dispense:   60 tablet    Refill:  0    30 d supply;  ** OV for RF **   Do not send RF request   montelukast (SINGULAIR) 10 MG tablet    Sig: Take 1 tablet (10 mg total) by mouth at bedtime.    Dispense:  30 tablet    Refill:  0   Vitamin D, Ergocalciferol, (DRISDOL) 1.25 MG (50000 UNIT) CAPS capsule    Sig: Take 1 capsule (50,000 Units total) by mouth every 7 (seven) days.    Dispense:  4 capsule    Refill:  0   levocetirizine (XYZAL) 5 MG tablet    Sig: Take 1 tablet (5 mg total) by mouth every evening.    Dispense:  30 tablet    Refill:  0     1. Pre-diabetes Brett Wilcox will continue to work on weight loss, exercise, and decreasing simple carbohydrates to help decrease the risk of diabetes.   Refill - metFORMIN (GLUCOPHAGE) 500 MG tablet; 1 po with lunch daily and 1 po dinner daily  Dispense: 60 tablet; Refill: 0  2. Seasonal allergies Symptoms are under great control on current medication regimen.  Refill- montelukast (SINGULAIR) 10 MG tablet; Take 1 tablet (10 mg total) by mouth at bedtime.  Dispense: 30 tablet; Refill: 0  Refill- levocetirizine (XYZAL) 5 MG tablet; Take 1 tablet (5 mg total) by mouth every evening.  Dispense: 30 tablet; Refill: 0  3. Vitamin D deficiency  Low Vitamin D level contributes to fatigue and are associated with obesity, breast, and colon cancer. He agrees to continue to take prescription Vitamin D @50 ,000 IU every week and will follow-up for routine testing of Vitamin D, at least 2-3 times per year to avoid over-replacement.  Refill- Vitamin D, Ergocalciferol, (DRISDOL) 1.25 MG (50000 UNIT) CAPS capsule; Take 1 capsule (50,000 Units total) by mouth every 7 (seven) days.  Dispense: 4 capsule; Refill: 0  4. Obesity with current BMI of 29 Increase days per week of weight lifting and various exercises of cardio.(ie, elliptical, then bike, then jog, etc.) this would be more helpful with building muscle and getting in better cardiovascular shape.   [name] is currently in  the action stage of change. As such, his goal is to continue with weight loss efforts. He has agreed to the Category 3 Plan.   Exercise goals: Increase days per week of weight lifting and various exercises of cardio.(ie, elliptical, then bike, then jog, etc.) this would be more helpful with building muscle and getting in better cardiovascular shape.   Behavioral modification strategies: increasing lean protein intake, decreasing simple carbohydrates, and avoiding temptations.  [name] has agreed to follow-up with our clinic in 4 weeks. He was informed of the importance of frequent follow-up visits to maximize his success with intensive lifestyle modifications for his multiple health conditions.   Objective:   Blood pressure 124/80, pulse 60, temperature 97.6 F (36.4 C), height 5\' 9"  (1.753 m), weight 196 lb (88.9 kg), SpO2 97 %. Body mass index is 28.94 kg/m.  General: Cooperative, alert, well developed, in no acute distress. HEENT: Conjunctivae and lids unremarkable. Cardiovascular: Regular rhythm.  Lungs: Normal work of breathing. Neurologic: No focal deficits.   Lab Results  Component Value Date   CREATININE 0.76 01/27/2022   BUN 17 01/27/2022   NA 139 01/27/2022   K 4.5 01/27/2022   CL 99 01/27/2022   CO2 26 01/27/2022   Lab Results  Component Value Date   ALT 37 01/27/2022   AST 25 01/27/2022   ALKPHOS 59 01/27/2022   BILITOT 0.5 01/27/2022   Lab Results  Component Value Date   HGBA1C 5.4 01/27/2022   HGBA1C 5.5 06/16/2021   HGBA1C 6.1 (H) 02/25/2021   Lab Results  Component Value Date   INSULIN 13.6 01/27/2022   INSULIN 12.2 06/16/2021   INSULIN 23.8 02/25/2021   Lab Results  Component Value Date   TSH 3.310 01/27/2022   Lab Results  Component Value Date   CHOL 172 01/27/2022   HDL 54 01/27/2022   LDLCALC 99 01/27/2022   TRIG 105 01/27/2022   CHOLHDL 3.9 02/25/2021   Lab Results  Component Value Date   VD25OH 63.8 01/27/2022   VD25OH 77.3  06/16/2021   VD25OH 25.7 (L) 02/25/2021   Lab Results  Component Value Date   WBC 6.0 06/16/2021   HGB 19.4 (H) 06/16/2021   HCT 58.7 (H) 06/16/2021   MCV 90 06/16/2021   PLT 222 06/16/2021   No results found for: "IRON", "TIBC", "FERRITIN"  Attestation Statements:   Reviewed by clinician on day of visit: allergies, medications, problem list, medical history, surgical history, family history, social history, and previous encounter notes.  I, 06/18/2021, RMA, am acting as 06/18/2021 for Malcolm Metro, DO.   I have reviewed the above documentation for accuracy and completeness, and I agree with the above. Energy manager, D.O.  The 21st Century Cures Act was signed into law in 2016 which  includes the topic of electronic health records.  This provides immediate access to information in MyChart.  This includes consultation notes, operative notes, office notes, lab results and pathology reports.  If you have any questions about what you read please let us know at your next visit so we can discuss your concerns and take corrective action if need be.  We are right here with you.

## 2022-05-11 ENCOUNTER — Encounter (INDEPENDENT_AMBULATORY_CARE_PROVIDER_SITE_OTHER): Payer: Self-pay

## 2022-05-16 ENCOUNTER — Other Ambulatory Visit (INDEPENDENT_AMBULATORY_CARE_PROVIDER_SITE_OTHER): Payer: Self-pay | Admitting: Family Medicine

## 2022-05-16 DIAGNOSIS — R7303 Prediabetes: Secondary | ICD-10-CM

## 2022-05-18 ENCOUNTER — Ambulatory Visit (INDEPENDENT_AMBULATORY_CARE_PROVIDER_SITE_OTHER): Payer: BC Managed Care – PPO | Admitting: Family Medicine

## 2022-05-18 ENCOUNTER — Other Ambulatory Visit (INDEPENDENT_AMBULATORY_CARE_PROVIDER_SITE_OTHER): Payer: Self-pay | Admitting: Family Medicine

## 2022-05-18 ENCOUNTER — Encounter (INDEPENDENT_AMBULATORY_CARE_PROVIDER_SITE_OTHER): Payer: Self-pay | Admitting: Family Medicine

## 2022-05-18 VITALS — BP 129/85 | HR 64 | Temp 97.7°F | Ht 69.0 in | Wt 196.0 lb

## 2022-05-18 DIAGNOSIS — J302 Other seasonal allergic rhinitis: Secondary | ICD-10-CM | POA: Diagnosis not present

## 2022-05-18 DIAGNOSIS — E669 Obesity, unspecified: Secondary | ICD-10-CM

## 2022-05-18 DIAGNOSIS — Z79899 Other long term (current) drug therapy: Secondary | ICD-10-CM | POA: Diagnosis not present

## 2022-05-18 DIAGNOSIS — E559 Vitamin D deficiency, unspecified: Secondary | ICD-10-CM

## 2022-05-18 DIAGNOSIS — Z6829 Body mass index (BMI) 29.0-29.9, adult: Secondary | ICD-10-CM

## 2022-05-18 DIAGNOSIS — Z6833 Body mass index (BMI) 33.0-33.9, adult: Secondary | ICD-10-CM

## 2022-05-18 DIAGNOSIS — R7303 Prediabetes: Secondary | ICD-10-CM

## 2022-05-18 MED ORDER — VITAMIN D (ERGOCALCIFEROL) 1.25 MG (50000 UNIT) PO CAPS: 50000.0000 [IU] | ORAL_CAPSULE | ORAL | 0 refills | Status: DC

## 2022-05-18 MED ORDER — MONTELUKAST SODIUM 10 MG PO TABS: 10.0000 mg | ORAL_TABLET | Freq: Every day | ORAL | 0 refills | Status: DC

## 2022-05-18 MED ORDER — METFORMIN HCL 500 MG PO TABS: ORAL_TABLET | ORAL | 0 refills | Status: DC

## 2022-05-18 MED ORDER — LEVOCETIRIZINE DIHYDROCHLORIDE 5 MG PO TABS: 5.0000 mg | ORAL_TABLET | Freq: Every evening | ORAL | 0 refills | Status: DC

## 2022-05-19 LAB — COMPREHENSIVE METABOLIC PANEL
ALT: 53 IU/L — ABNORMAL HIGH (ref 0–44)
AST: 36 IU/L (ref 0–40)
Albumin/Globulin Ratio: 2.6 — ABNORMAL HIGH (ref 1.2–2.2)
Albumin: 5 g/dL — ABNORMAL HIGH (ref 3.8–4.9)
Alkaline Phosphatase: 51 IU/L (ref 44–121)
BUN/Creatinine Ratio: 19 (ref 9–20)
BUN: 18 mg/dL (ref 6–24)
Bilirubin Total: 0.5 mg/dL (ref 0.0–1.2)
CO2: 25 mmol/L (ref 20–29)
Calcium: 10.1 mg/dL (ref 8.7–10.2)
Chloride: 100 mmol/L (ref 96–106)
Creatinine, Ser: 0.93 mg/dL (ref 0.76–1.27)
Globulin, Total: 1.9 g/dL (ref 1.5–4.5)
Glucose: 87 mg/dL (ref 70–99)
Potassium: 4.4 mmol/L (ref 3.5–5.2)
Sodium: 141 mmol/L (ref 134–144)
Total Protein: 6.9 g/dL (ref 6.0–8.5)
eGFR: 98 mL/min/{1.73_m2} (ref >59)

## 2022-05-19 LAB — VITAMIN B12: Vitamin B-12: 507 pg/mL (ref 232–1245)

## 2022-05-19 LAB — MAGNESIUM: Magnesium: 2.1 mg/dL (ref 1.6–2.3)

## 2022-05-24 NOTE — Progress Notes (Signed)
Chief Complaint:   OBESITY Brett Wilcox is here to discuss his progress with his obesity treatment plan along with follow-up of his obesity related diagnoses. Brett Wilcox is on the Category 3 Plan and states he is following his eating plan approximately 85% of the time. Brett Wilcox states he is doing push-ups, sit-ups/weights, playing tennis 30-90 minutes 2-5 times per week.  Today's visit was #: 20 Starting weight: 226 lbs Starting date: 02/25/2021 Today's weight: 196 lbs Today's date: 05/18/2022 Total lbs lost to date: 30 Total lbs lost since last in-office visit: 0  Interim History: Brett Wilcox is here for a follow up office visit. We reviewed his meal plan and all questions were answered. Patient's food recall appears to be accurate and consistent with what is on plan when he is following it. When eating on plan, his hunger and cravings are well controlled.    Subjective:   1. Pre-diabetes Brett Wilcox denies hunger and cravings and is tolerating Metformin well. He feels it is helping him with candy cravings.   2. Vitamin D deficiency He is currently taking prescription vitamin D 50,000 IU each week. He denies nausea, vomiting or muscle weakness.  3. High risk medications (not anticoagulants) long-term use Pt is tolerating meds well with no side effects.  4. Seasonal allergies Thanos's symptoms are controlled on current regimen. Medication: Xyzal and Singulair  Assessment/Plan:   Orders Placed This Encounter  Procedures   Magnesium   Vitamin B12   Comprehensive metabolic panel    Medications Discontinued During This Encounter  Medication Reason   metFORMIN (GLUCOPHAGE) 500 MG tablet Reorder   montelukast (SINGULAIR) 10 MG tablet Reorder   Vitamin D, Ergocalciferol, (DRISDOL) 1.25 MG (50000 UNIT) CAPS capsule Reorder   levocetirizine (XYZAL) 5 MG tablet Reorder     Meds ordered this encounter  Medications   metFORMIN (GLUCOPHAGE) 500 MG tablet    Sig: 1 po with lunch daily  and 1 po dinner daily    Dispense:  120 tablet    Refill:  0    60 d supply;  ** OV for RF **   Do not send RF request   montelukast (SINGULAIR) 10 MG tablet    Sig: Take 1 tablet (10 mg total) by mouth at bedtime.    Dispense:  60 tablet    Refill:  0   Vitamin D, Ergocalciferol, (DRISDOL) 1.25 MG (50000 UNIT) CAPS capsule    Sig: Take 1 capsule (50,000 Units total) by mouth every 7 (seven) days.    Dispense:  8 capsule    Refill:  0   levocetirizine (XYZAL) 5 MG tablet    Sig: Take 1 tablet (5 mg total) by mouth every evening.    Dispense:  60 tablet    Refill:  0     1. Pre-diabetes Notnamed will continue to work on weight loss, exercise, and decreasing simple carbohydrates to help decrease the risk of diabetes. Check B12, magnesium, and CMP due to being on Metformin after 3-4 months.  Refill- metFORMIN (GLUCOPHAGE) 500 MG tablet; 1 po with lunch daily and 1 po dinner daily  Dispense: 120 tablet; Refill: 0  2. Vitamin D deficiency Low Vitamin D level contributes to fatigue and are associated with obesity, breast, and colon cancer. He agrees to continue to take prescription Vitamin D @50 ,000 IU every week and will follow-up for routine testing of Vitamin D, at least 2-3 times per year to avoid over-replacement.  Refill- Vitamin D, Ergocalciferol, (DRISDOL) 1.25 MG (  50000 UNIT) CAPS capsule; Take 1 capsule (50,000 Units total) by mouth every 7 (seven) days.  Dispense: 8 capsule; Refill: 0  3. High risk medications (not anticoagulants) long-term use After being on Metformin for 3-4 months, we will recheck B12 and magnesium.  Lab/Orders today: - Magnesium - Vitamin B12 - Comprehensive metabolic panel  4. Seasonal allergies Continue current treatment plan.  Lab/Orders today: - montelukast (SINGULAIR) 10 MG tablet; Take 1 tablet (10 mg total) by mouth at bedtime.  Dispense: 60 tablet; Refill: 0 - levocetirizine (XYZAL) 5 MG tablet; Take 1 tablet (5 mg total) by mouth every  evening.  Dispense: 60 tablet; Refill: 0  5. Obesity with current BMI of 29 Jashon is currently in the action stage of change. As such, his goal is to continue with weight loss efforts. He has agreed to the Category 3 Plan.   Pt will focus on increasing weight lifting for next OV.  Exercise goals:  As is, but increase weight lifting.  Behavioral modification strategies: better snacking choices and planning for success.  Brett Wilcox has agreed to follow-up with our clinic in 5 weeks. He was informed of the importance of frequent follow-up visits to maximize his success with intensive lifestyle modifications for his multiple health conditions.   Brett Wilcox was informed we would discuss his lab results at his next visit unless there is a critical issue that needs to be addressed sooner. Brett Wilcox agreed to keep his next visit at the agreed upon time to discuss these results.  Objective:   Blood pressure 129/85, pulse 64, temperature 97.7 F (36.5 C), height 5\' 9"  (1.753 m), weight 196 lb (88.9 kg), SpO2 100 %. Body mass index is 28.94 kg/m.  General: Cooperative, alert, well developed, in no acute distress. HEENT: Conjunctivae and lids unremarkable. Cardiovascular: Regular rhythm.  Lungs: Normal work of breathing. Neurologic: No focal deficits.   Lab Results  Component Value Date   CREATININE 0.93 05/18/2022   BUN 18 05/18/2022   NA 141 05/18/2022   K 4.4 05/18/2022   CL 100 05/18/2022   CO2 25 05/18/2022   Lab Results  Component Value Date   ALT 53 (H) 05/18/2022   AST 36 05/18/2022   ALKPHOS 51 05/18/2022   BILITOT 0.5 05/18/2022   Lab Results  Component Value Date   HGBA1C 5.4 01/27/2022   HGBA1C 5.5 06/16/2021   HGBA1C 6.1 (H) 02/25/2021   Lab Results  Component Value Date   INSULIN 13.6 01/27/2022   INSULIN 12.2 06/16/2021   INSULIN 23.8 02/25/2021   Lab Results  Component Value Date   TSH 3.310 01/27/2022   Lab Results  Component Value Date   CHOL 172 01/27/2022    HDL 54 01/27/2022   LDLCALC 99 01/27/2022   TRIG 105 01/27/2022   CHOLHDL 3.9 02/25/2021   Lab Results  Component Value Date   VD25OH 63.8 01/27/2022   VD25OH 77.3 06/16/2021   VD25OH 25.7 (L) 02/25/2021   Lab Results  Component Value Date   WBC 6.0 06/16/2021   HGB 19.4 (H) 06/16/2021   HCT 58.7 (H) 06/16/2021   MCV 90 06/16/2021   PLT 222 06/16/2021    Attestation Statements:   Reviewed by clinician on day of visit: allergies, medications, problem list, medical history, surgical history, family history, social history, and previous encounter notes.  I, 06/18/2021, BS, CMA, am acting as transcriptionist for Kyung Rudd, DO.   I have reviewed the above documentation for accuracy and completeness, and I agree with  the above. Marjory Sneddon, D.O.  The [place] was signed into law in 2016 which includes the topic of electronic health records.  This provides immediate access to information in MyChart.  This includes consultation notes, operative notes, office notes, lab results and pathology reports.  If you have any questions about what you read please let us know at your next visit so we can discuss your concerns and take corrective action if need be.  We are right here with you.

## 2022-05-25 DIAGNOSIS — Z85828 Personal history of other malignant neoplasm of skin: Secondary | ICD-10-CM | POA: Diagnosis not present

## 2022-05-25 DIAGNOSIS — D485 Neoplasm of uncertain behavior of skin: Secondary | ICD-10-CM | POA: Diagnosis not present

## 2022-05-25 DIAGNOSIS — L821 Other seborrheic keratosis: Secondary | ICD-10-CM | POA: Diagnosis not present

## 2022-05-25 DIAGNOSIS — D225 Melanocytic nevi of trunk: Secondary | ICD-10-CM | POA: Diagnosis not present

## 2022-05-25 DIAGNOSIS — L570 Actinic keratosis: Secondary | ICD-10-CM | POA: Diagnosis not present

## 2022-05-25 DIAGNOSIS — L308 Other specified dermatitis: Secondary | ICD-10-CM | POA: Diagnosis not present

## 2022-06-22 ENCOUNTER — Ambulatory Visit (INDEPENDENT_AMBULATORY_CARE_PROVIDER_SITE_OTHER): Payer: BC Managed Care – PPO | Admitting: Family Medicine

## 2022-06-22 ENCOUNTER — Encounter (INDEPENDENT_AMBULATORY_CARE_PROVIDER_SITE_OTHER): Payer: Self-pay | Admitting: Family Medicine

## 2022-06-22 ENCOUNTER — Other Ambulatory Visit (INDEPENDENT_AMBULATORY_CARE_PROVIDER_SITE_OTHER): Payer: Self-pay | Admitting: Family Medicine

## 2022-06-22 VITALS — BP 122/78 | HR 66 | Temp 98.3°F | Ht 69.0 in | Wt 191.0 lb

## 2022-06-22 DIAGNOSIS — E669 Obesity, unspecified: Secondary | ICD-10-CM

## 2022-06-22 DIAGNOSIS — E559 Vitamin D deficiency, unspecified: Secondary | ICD-10-CM | POA: Diagnosis not present

## 2022-06-22 DIAGNOSIS — Z9189 Other specified personal risk factors, not elsewhere classified: Secondary | ICD-10-CM

## 2022-06-22 DIAGNOSIS — R7303 Prediabetes: Secondary | ICD-10-CM | POA: Diagnosis not present

## 2022-06-22 DIAGNOSIS — J302 Other seasonal allergic rhinitis: Secondary | ICD-10-CM | POA: Diagnosis not present

## 2022-06-22 DIAGNOSIS — E66811 Obesity, class 1: Secondary | ICD-10-CM

## 2022-06-22 DIAGNOSIS — Z6828 Body mass index (BMI) 28.0-28.9, adult: Secondary | ICD-10-CM

## 2022-06-22 MED ORDER — VITAMIN D (ERGOCALCIFEROL) 1.25 MG (50000 UNIT) PO CAPS: 50000.0000 [IU] | ORAL_CAPSULE | ORAL | 0 refills | Status: DC

## 2022-06-22 MED ORDER — MONTELUKAST SODIUM 10 MG PO TABS: 10.0000 mg | ORAL_TABLET | Freq: Every day | ORAL | 0 refills | Status: DC

## 2022-06-22 MED ORDER — METFORMIN HCL 500 MG PO TABS: ORAL_TABLET | ORAL | 0 refills | Status: DC

## 2022-06-22 MED ORDER — LEVOCETIRIZINE DIHYDROCHLORIDE 5 MG PO TABS: 5.0000 mg | ORAL_TABLET | Freq: Every evening | ORAL | 0 refills | Status: DC

## 2022-06-26 NOTE — Progress Notes (Signed)
Chief Complaint:   OBESITY Brett Wilcox is here to discuss his progress with his obesity treatment plan along with follow-up of his obesity related diagnoses. Brett Wilcox is on the Category 3 Plan and states he is following his eating plan approximately 85% of the time. Brett Wilcox states he is doing weight for 30-40 5 days per week and playing tennis for 90 minutes 2 times per week.  Today's visit was #: 21 Starting weight: 226 lbs Starting date: 02/25/2021 Today's weight: 191 lbs Today's date: 06/22/2022 Total lbs lost to date: 35 lbs Total lbs lost since last in-office visit: 5 lbs  Interim History: Brett Wilcox is here for a follow up office visit.  We reviewed his meal plan and all questions were answered.  Patient's food recall appears to be accurate and consistent with what is on plan when he is following it.   When eating on plan, his hunger and cravings are well controlled.    Subjective:   1. Pre-diabetes Discussed recent labs (CMP, B12, Magnesium) with Brett Wilcox today. All essentially within normal limits except his ALT - slightly elevated due to Tylenol and or alcohol use prior to blood draw.  2. Vitamin D deficiency Brett Wilcox is tolerating medication(s) well without side effects.  Medication compliance is good as patient endorses taking it as prescribed.  The patient denies additional concerns regarding this condition.       3. Seasonal allergies Brett Wilcox's symptoms are controlled on current regimen. Medication: Xyzal and Singulair  4. At risk for impaired function of liver Brett Wilcox is at risk for impaired function of liver due to his elevated ALT level.  Assessment/Plan:  No orders of the defined types were placed in this encounter.   Medications Discontinued During This Encounter  Medication Reason   metFORMIN (GLUCOPHAGE) 500 MG tablet Reorder   montelukast (SINGULAIR) 10 MG tablet Reorder   Vitamin D, Ergocalciferol, (DRISDOL) 1.25 MG (50000 UNIT) CAPS capsule  Reorder   levocetirizine (XYZAL) 5 MG tablet Reorder     Meds ordered this encounter  Medications   Vitamin D, Ergocalciferol, (DRISDOL) 1.25 MG (50000 UNIT) CAPS capsule    Sig: Take 1 capsule (50,000 Units total) by mouth every 7 (seven) days.    Dispense:  8 capsule    Refill:  0   metFORMIN (GLUCOPHAGE) 500 MG tablet    Sig: 1 po with lunch daily and 1 po dinner daily    Dispense:  120 tablet    Refill:  0    60 d supply;  ** OV for RF **   Do not send RF request   levocetirizine (XYZAL) 5 MG tablet    Sig: Take 1 tablet (5 mg total) by mouth every evening.    Dispense:  60 tablet    Refill:  0   montelukast (SINGULAIR) 10 MG tablet    Sig: Take 1 tablet (10 mg total) by mouth at bedtime.    Dispense:  60 tablet    Refill:  0     1. Pre-diabetes Brett Wilcox will continue to work on weight loss, exercise, and decreasing simple carbohydrates to help decrease the risk of diabetes. Will refill Metformin as follows: - metFORMIN (GLUCOPHAGE) 500 MG tablet; 1 po with lunch daily and 1 po dinner daily  Dispense: 120 tablet; Refill: 0  2. Vitamin D deficiency Low Vitamin D level contributes to fatigue and are associated with obesity, breast, and colon cancer. He agrees to continue to take prescription Vitamin D @  50,000 IU every week and will follow-up for routine testing of Vitamin D, at least 2-3 times per year to avoid over-replacement. Will refill Vitamin D as follows: - Vitamin D, Ergocalciferol, (DRISDOL) 1.25 MG (50000 UNIT) CAPS capsule; Take 1 capsule (50,000 Units total) by mouth every 7 (seven) days.  Dispense: 8 capsule; Refill: 0  3. Seasonal allergies Will refill Xyzal and Singulair as follows: - levocetirizine (XYZAL) 5 MG tablet; Take 1 tablet (5 mg total) by mouth every evening.  Dispense: 60 tablet; Refill: 0 - montelukast (SINGULAIR) 10 MG tablet; Take 1 tablet (10 mg total) by mouth at bedtime.  Dispense: 60 tablet; Refill: 0  4. At risk for impaired function of  liver Brett Wilcox was given approximately 9 minutes of counseling today regarding prevention of impaired liver function. Brett Wilcox was educated about his risk of developing NASH or even liver failure and advised that the only proven treatment for NAFLD was weight loss of at least 5-10% of body weight.    5. Obesity with current BMI of 28.3 Brett Wilcox is currently in the action stage of change. As such, his goal is to continue with weight loss efforts. He has agreed to the Category 3 Plan.   Exercise goals: As is.  Behavioral modification strategies: increasing lean protein intake, decreasing simple carbohydrates, and planning for success.  Brett Wilcox has agreed to follow-up with our clinic in 6-8 weeks. He was informed of the importance of frequent follow-up visits to maximize his success with intensive lifestyle modifications for his multiple health conditions.   Objective:   Blood pressure 122/78, pulse 66, temperature 98.3 F (36.8 C), height 5\' 9"  (1.753 m), weight 191 lb (86.6 kg), SpO2 97 %. Body mass index is 28.21 kg/m.  General: Cooperative, alert, well developed, in no acute distress. HEENT: Conjunctivae and lids unremarkable. Cardiovascular: Regular rhythm.  Lungs: Normal work of breathing. Neurologic: No focal deficits.   Lab Results  Component Value Date   CREATININE 0.93 05/18/2022   BUN 18 05/18/2022   NA 141 05/18/2022   K 4.4 05/18/2022   CL 100 05/18/2022   CO2 25 05/18/2022   Lab Results  Component Value Date   ALT 53 (H) 05/18/2022   AST 36 05/18/2022   ALKPHOS 51 05/18/2022   BILITOT 0.5 05/18/2022   Lab Results  Component Value Date   HGBA1C 5.4 01/27/2022   HGBA1C 5.5 06/16/2021   HGBA1C 6.1 (H) 02/25/2021   Lab Results  Component Value Date   INSULIN 13.6 01/27/2022   INSULIN 12.2 06/16/2021   INSULIN 23.8 02/25/2021   Lab Results  Component Value Date   TSH 3.310 01/27/2022   Lab Results  Component Value Date   CHOL 172 01/27/2022   HDL 54  01/27/2022   LDLCALC 99 01/27/2022   TRIG 105 01/27/2022   CHOLHDL 3.9 02/25/2021   Lab Results  Component Value Date   VD25OH 63.8 01/27/2022   VD25OH 77.3 06/16/2021   VD25OH 25.7 (L) 02/25/2021   Lab Results  Component Value Date   WBC 6.0 06/16/2021   HGB 19.4 (H) 06/16/2021   HCT 58.7 (H) 06/16/2021   MCV 90 06/16/2021   PLT 222 06/16/2021   No results found for: "IRON", "TIBC", "FERRITIN"  Attestation Statements:   Reviewed by clinician on day of visit: allergies, medications, problem list, medical history, surgical history, family history, social history, and previous encounter notes.  I09/16/2022, CMA, am acting as transcriptionist for Dr. Paulla Fore, DO.  I have reviewed the  above documentation for accuracy and completeness, and I agree with the above. Marjory Sneddon, D.O.  The [place] was signed into law in 2016 which includes the topic of electronic health records.  This provides immediate access to information in MyChart.  This includes consultation notes, operative notes, office notes, lab results and pathology reports.  If you have any questions about what you read please let us know at your next visit so we can discuss your concerns and take corrective action if need be.  We are right here with you.

## 2022-07-12 ENCOUNTER — Other Ambulatory Visit (INDEPENDENT_AMBULATORY_CARE_PROVIDER_SITE_OTHER): Payer: Self-pay | Admitting: Family Medicine

## 2022-07-12 DIAGNOSIS — E559 Vitamin D deficiency, unspecified: Secondary | ICD-10-CM

## 2022-08-10 ENCOUNTER — Encounter (INDEPENDENT_AMBULATORY_CARE_PROVIDER_SITE_OTHER): Payer: Self-pay | Admitting: Family Medicine

## 2022-08-10 ENCOUNTER — Ambulatory Visit (INDEPENDENT_AMBULATORY_CARE_PROVIDER_SITE_OTHER): Payer: BC Managed Care – PPO | Admitting: Family Medicine

## 2022-08-10 VITALS — BP 123/81 | HR 68 | Temp 98.0°F | Ht 69.0 in | Wt 191.8 lb

## 2022-08-10 DIAGNOSIS — E66811 Obesity, class 1: Secondary | ICD-10-CM

## 2022-08-10 DIAGNOSIS — E559 Vitamin D deficiency, unspecified: Secondary | ICD-10-CM

## 2022-08-10 DIAGNOSIS — Z6828 Body mass index (BMI) 28.0-28.9, adult: Secondary | ICD-10-CM

## 2022-08-10 DIAGNOSIS — J302 Other seasonal allergic rhinitis: Secondary | ICD-10-CM

## 2022-08-10 DIAGNOSIS — E669 Obesity, unspecified: Secondary | ICD-10-CM

## 2022-08-10 DIAGNOSIS — R7303 Prediabetes: Secondary | ICD-10-CM

## 2022-08-10 MED ORDER — METFORMIN HCL 500 MG PO TABS: ORAL_TABLET | ORAL | 0 refills | Status: DC

## 2022-08-10 MED ORDER — LEVOCETIRIZINE DIHYDROCHLORIDE 5 MG PO TABS: 5.0000 mg | ORAL_TABLET | Freq: Every evening | ORAL | 0 refills | Status: DC

## 2022-08-10 MED ORDER — MONTELUKAST SODIUM 10 MG PO TABS: 10.0000 mg | ORAL_TABLET | Freq: Every day | ORAL | 0 refills | Status: DC

## 2022-08-10 MED ORDER — VITAMIN D (ERGOCALCIFEROL) 1.25 MG (50000 UNIT) PO CAPS: 50000.0000 [IU] | ORAL_CAPSULE | ORAL | 0 refills | Status: DC

## 2022-08-23 NOTE — Progress Notes (Signed)
Chief Complaint:   OBESITY Brett Wilcox is here to discuss his progress with his obesity treatment plan along with follow-up of his obesity related diagnoses. Brett Wilcox is on the Category 3 Plan and states he is following his eating plan approximately 80% of the time. Brett Wilcox states he is playing tennis, doing push ups and curls 20-90 minutes, 3-5 times per week.  Today's visit was #: 22 Starting weight: 226 lbs Starting date: 02/25/2021 Today's weight: 191 lbs Today's date: 08/10/2022 Total lbs lost to date: 35 lbs Total lbs lost since last in-office visit: 0  Interim History: Doing great but patient desires to be in the mid 180's for his weight.  Has ETOH  beverage and some snacks before dinner.  Not always calculating.   Subjective:   1. Pre-diabetes Tramane has a diagnosis of prediabetes based on his elevated HgA1c and was informed this puts him at greater risk of developing diabetes. He continues to work on diet and exercise to decrease his risk of diabetes. He denies nausea or hypoglycemia.  2. Seasonal allergies MOUHAMAD TEED has seasonal allergies that have been more bothersome with the change of weather/season.   Pt takes the prescribed medications regularlywhich has improved symptoms some and his quality of life.  Tolerating well, denies concerns. Refill prn- see below.   - Seasonal / environmental allergies and pathophysiology of disease process discussed with patient.  - Preventative strategies as first line for management discussed.  I encouraged use of KN or N-95 mask prn as well as sterile saline rinses such as Lloyd Huger Med or AYR sinus rinses to be done once- twice daily and after any prolonged exposure to the environment or allergen.    It is best to use distilled water or previously boiled water    - If eyes are itchy or irritated feeling when your seasonal allergies get bad, ok to use Naphcon-A over-the-counter eyedrops as needed - Encouraged to shower in evenings if they  have any prolonged exposure to allergens during the day  3. Vitamin D deficiency He is currently taking prescription vitamin D 50,000 IU each week. He denies nausea, vomiting or muscle weakness.  Assessment/Plan:  No orders of the defined types were placed in this encounter.   Medications Discontinued During This Encounter  Medication Reason   Vitamin D, Ergocalciferol, (DRISDOL) 1.25 MG (50000 UNIT) CAPS capsule Reorder   metFORMIN (GLUCOPHAGE) 500 MG tablet Reorder   levocetirizine (XYZAL) 5 MG tablet Reorder   montelukast (SINGULAIR) 10 MG tablet Reorder     Meds ordered this encounter  Medications   Vitamin D, Ergocalciferol, (DRISDOL) 1.25 MG (50000 UNIT) CAPS capsule    Sig: Take 1 capsule (50,000 Units total) by mouth every 7 (seven) days.    Dispense:  12 capsule    Refill:  0   levocetirizine (XYZAL) 5 MG tablet    Sig: Take 1 tablet (5 mg total) by mouth every evening.    Dispense:  90 tablet    Refill:  0   metFORMIN (GLUCOPHAGE) 500 MG tablet    Sig: 1 po with lunch daily and 1 po dinner daily    Dispense:  180 tablet    Refill:  0    60 d supply;  ** OV for RF **   Do not send RF request   montelukast (SINGULAIR) 10 MG tablet    Sig: Take 1 tablet (10 mg total) by mouth at bedtime.    Dispense:  90 tablet  Refill:  0     1. Pre-diabetes Refill - metFORMIN (GLUCOPHAGE) 500 MG tablet; 1 po with lunch daily and 1 po dinner daily  Dispense: 180 tablet; Refill: 0  2. Seasonal allergies Refill - levocetirizine (XYZAL) 5 MG tablet; Take 1 tablet (5 mg total) by mouth every evening.  Dispense: 90 tablet; Refill: 0  Refill - montelukast (SINGULAIR) 10 MG tablet; Take 1 tablet (10 mg total) by mouth at bedtime.  Dispense: 90 tablet; Refill: 0  3. Vitamin D deficiency Refill - Vitamin D, Ergocalciferol, (DRISDOL) 1.25 MG (50000 UNIT) CAPS capsule; Take 1 capsule (50,000 Units total) by mouth every 7 (seven) days.  Dispense: 12 capsule; Refill: 0  4. Obesity with  current BMI of 28.3 Microwave meal, recipes, Thanksgiving strategies handouts given.   Doctor is currently in the action stage of change. As such, his goal is to continue with weight loss efforts. He has agreed to change to the Category 2 Plan+6 oz of protein at lunch and 8-10 oz of protein at dinner.   Exercise goals:  As is.   Behavioral modification strategies: increasing lean protein intake.  Brett Wilcox has agreed to follow-up with our clinic in 2-3 months. He was informed of the importance of frequent follow-up visits to maximize his success with intensive lifestyle modifications for his multiple health conditions.  Objective:   Blood pressure 123/81, pulse 68, temperature 98 F (36.7 C), height 5\' 9"  (1.753 m), weight 191 lb 12.8 oz (87 kg), SpO2 96 %. Body mass index is 28.32 kg/m.  General: Cooperative, alert, well developed, in no acute distress. HEENT: Conjunctivae and lids unremarkable. Cardiovascular: Regular rhythm.  Lungs: Normal work of breathing. Neurologic: No focal deficits.   Lab Results  Component Value Date   CREATININE 0.93 05/18/2022   BUN 18 05/18/2022   NA 141 05/18/2022   K 4.4 05/18/2022   CL 100 05/18/2022   CO2 25 05/18/2022   Lab Results  Component Value Date   ALT 53 (H) 05/18/2022   AST 36 05/18/2022   ALKPHOS 51 05/18/2022   BILITOT 0.5 05/18/2022   Lab Results  Component Value Date   HGBA1C 5.4 01/27/2022   HGBA1C 5.5 06/16/2021   HGBA1C 6.1 (H) 02/25/2021   Lab Results  Component Value Date   INSULIN 13.6 01/27/2022   INSULIN 12.2 06/16/2021   INSULIN 23.8 02/25/2021   Lab Results  Component Value Date   TSH 3.310 01/27/2022   Lab Results  Component Value Date   CHOL 172 01/27/2022   HDL 54 01/27/2022   LDLCALC 99 01/27/2022   TRIG 105 01/27/2022   CHOLHDL 3.9 02/25/2021   Lab Results  Component Value Date   VD25OH 63.8 01/27/2022   VD25OH 77.3 06/16/2021   VD25OH 25.7 (L) 02/25/2021   Lab Results  Component Value  Date   WBC 6.0 06/16/2021   HGB 19.4 (H) 06/16/2021   HCT 58.7 (H) 06/16/2021   MCV 90 06/16/2021   PLT 222 06/16/2021   No results found for: "IRON", "TIBC", "FERRITIN"  Attestation Statements:   Reviewed by clinician on day of visit: allergies, medications, problem list, medical history, surgical history, family history, social history, and previous encounter notes.  I, 06/18/2021, RMA, am acting as [employer] for Energy manager, DO.   I have reviewed the above documentation for accuracy and completeness, and I agree with the above. [employer], D.O.  The 21st Century Cures Act was signed into law in 2016 which includes the topic  of electronic health records.  This provides immediate access to information in MyChart.  This includes consultation notes, operative notes, office notes, lab results and pathology reports.  If you have any questions about what you read please let us know at your next visit so we can discuss your concerns and take corrective action if need be.  We are right here with you.

## 2022-10-07 DIAGNOSIS — E782 Mixed hyperlipidemia: Secondary | ICD-10-CM | POA: Diagnosis not present

## 2022-10-07 DIAGNOSIS — Z125 Encounter for screening for malignant neoplasm of prostate: Secondary | ICD-10-CM | POA: Diagnosis not present

## 2022-10-07 DIAGNOSIS — I10 Essential (primary) hypertension: Secondary | ICD-10-CM | POA: Diagnosis not present

## 2022-10-07 DIAGNOSIS — E291 Testicular hypofunction: Secondary | ICD-10-CM | POA: Diagnosis not present

## 2022-10-07 DIAGNOSIS — E039 Hypothyroidism, unspecified: Secondary | ICD-10-CM | POA: Diagnosis not present

## 2022-10-20 ENCOUNTER — Ambulatory Visit (INDEPENDENT_AMBULATORY_CARE_PROVIDER_SITE_OTHER): Payer: BC Managed Care – PPO | Admitting: Family Medicine

## 2022-10-20 ENCOUNTER — Encounter (INDEPENDENT_AMBULATORY_CARE_PROVIDER_SITE_OTHER): Payer: Self-pay | Admitting: Family Medicine

## 2022-10-20 VITALS — BP 124/81 | HR 67 | Temp 98.0°F | Ht 69.0 in | Wt 193.2 lb

## 2022-10-20 DIAGNOSIS — R7303 Prediabetes: Secondary | ICD-10-CM

## 2022-10-20 DIAGNOSIS — E669 Obesity, unspecified: Secondary | ICD-10-CM | POA: Diagnosis not present

## 2022-10-20 DIAGNOSIS — R400 Somnolence: Secondary | ICD-10-CM | POA: Diagnosis not present

## 2022-10-20 DIAGNOSIS — E559 Vitamin D deficiency, unspecified: Secondary | ICD-10-CM | POA: Diagnosis not present

## 2022-10-20 DIAGNOSIS — Z6828 Body mass index (BMI) 28.0-28.9, adult: Secondary | ICD-10-CM

## 2022-10-20 MED ORDER — METFORMIN HCL 500 MG PO TABS: ORAL_TABLET | ORAL | 0 refills | Status: DC

## 2022-10-20 MED ORDER — VITAMIN D (ERGOCALCIFEROL) 1.25 MG (50000 UNIT) PO CAPS: 50000.0000 [IU] | ORAL_CAPSULE | ORAL | 0 refills | Status: DC

## 2022-11-02 DIAGNOSIS — G4719 Other hypersomnia: Secondary | ICD-10-CM | POA: Diagnosis not present

## 2022-11-02 DIAGNOSIS — E782 Mixed hyperlipidemia: Secondary | ICD-10-CM | POA: Diagnosis not present

## 2022-11-07 NOTE — Progress Notes (Signed)
Chief Complaint:   OBESITY Brett Wilcox is here to discuss his progress with his obesity treatment plan along with follow-up of his obesity related diagnoses. Brett Wilcox is on the Category 2 Plan + 6 oz of protein at lunch and 8-10 oz of protein at dinner and states he is following his eating plan approximately 70% of the time. Brett Wilcox states he is doing push-ups, sit-ups, weights, and playing Tennis for 90 minutes 5 times per week.  Today's visit was #: 23 Starting weight: 226 lbs Starting date: 02/25/2021 Today's weight: 193 lbs Today's date: 10/20/2022 Total lbs lost to date: 33 Total lbs lost since last in-office visit: 0  Interim History: Brett Wilcox has a new PCP, Brett Wilcox. Patient had labs done with his PCP (CBC, CMP, TSH), all essentially within normal limits. Only slight abnormal Hgb at 17.2, and slightly elevated due to testosterone replacement therapy. All done on January 5th.   Subjective:   1. Sleepiness Brett Wilcox was seen by his PCP recently on January 5th. Labs were reviewed with the patient on his cell phone. His PCP did a home sleep study on him and he asked me why today. He doesn't think he snores (wife Brett Wilcox never told him that he does), and he sleeps well.   2. Vitamin D deficiency Brett Wilcox's last Vitamin D level was at goal at 63.8.  3. Pre-diabetes Last A1c on 02/25/2021 was 6.1, and recently 6-8 months ago it was within normal limits. He has no concerns.   Assessment/Plan:  No orders of the defined types were placed in this encounter.   Medications Discontinued During This Encounter  Medication Reason   Vitamin D, Ergocalciferol, (DRISDOL) 1.25 MG (50000 UNIT) CAPS capsule Reorder   metFORMIN (GLUCOPHAGE) 500 MG tablet Reorder     Meds ordered this encounter  Medications   metFORMIN (GLUCOPHAGE) 500 MG tablet    Sig: 1 po with lunch daily and 1 po dinner daily    Dispense:  180 tablet    Refill:  0    90 d supply;  ** OV for RF **   Do not send RF request    Vitamin D, Ergocalciferol, (DRISDOL) 1.25 MG (50000 UNIT) CAPS capsule    Sig: Take 1 capsule (50,000 Units total) by mouth every 7 (seven) days.    Dispense:  12 capsule    Refill:  0     1. Sleepiness ESS was performed on the patient today by myself, and his score was zero. He states his home sleep study he did through his PCP recently, he did not do it properly. He was educated on ESS score of zero. Patient states that he would never wear CPAP mask regardless and does not want a referral to sleep medicine. I discussed with the patient and agreed that there is no need. I recommended him to contact his new PCP to inform him. See separate data for ESS details that was scanned into the patient's chart today.   2. Vitamin D deficiency We will refill Ergo for 90 days. We will recheck labs at his next office visit.   - I again reiterated the importance of vitamin D (as well as calcium) to their health and wellbeing.  - I reviewed possible symptoms of low Vitamin D:  low energy, depressed mood, muscle aches, joint aches, osteoporosis etc. - low Vitamin D levels may be linked to an increased risk of cardiovascular events and even increased risk of cancers- such as colon and breast.  -  ideal vitamin D levels reviewed with patient  - I recommend pt take a  weekly prescription vit D - see script below   - Informed patient this may be a lifelong thing, and he was encouraged to continue to take the medicine until told otherwise.    - weight loss will likely improve availability of vitamin D, thus encouraged Brett Wilcox to continue with meal plan and their weight loss efforts to further improve this condition.  Thus, we will need to monitor levels regularly (every 3-4 mo on average) to keep levels within normal limits and prevent over supplementation. - pt's questions and concerns regarding this condition addressed.  - Vitamin D, Ergocalciferol, (DRISDOL) 1.25 MG (50000 UNIT) CAPS capsule; Take 1 capsule  (50,000 Units total) by mouth every 7 (seven) days.  Dispense: 12 capsule; Refill: 0  3. Pre-diabetes We will refill metformin for 90 days. Patient was counseled on increasing protein and decreasing simple carbohydrates. We will recheck labs at his next office visit.   - metFORMIN (GLUCOPHAGE) 500 MG tablet; 1 po with lunch daily and 1 po dinner daily  Dispense: 180 tablet; Refill: 0  4. Obesity with current BMI of 28.5 Brett Wilcox is currently in the action stage of change. As such, his goal is to continue with weight loss efforts. He has agreed to the Category 2 Plan + 6 oz of protein at lunch and 8-10 oz of protein at dinner.   Time was taken to review labs from PCP. Discussed with the patient strength training exercise, such as squats, planks, lunges, and upper body exercises as well.   Exercise goals: As is.   Behavioral modification strategies: increasing water intake and planning for success.  Brett Wilcox has agreed to follow-up with our clinic in 12 weeks or sooner if having issues. He was informed of the importance of frequent follow-up visits to maximize his success with intensive lifestyle modifications for his multiple health conditions.   Objective:   Blood pressure 124/81, pulse 67, temperature 98 F (36.7 C), height 5' 9"$  (1.753 m), weight 193 lb 3.2 oz (87.6 kg), SpO2 95 %. Body mass index is 28.53 kg/m.  General: Cooperative, alert, well developed, in no acute distress. HEENT: Conjunctivae and lids unremarkable. Cardiovascular: Regular rhythm.  Lungs: Normal work of breathing. Neurologic: No focal deficits.   Lab Results  Component Value Date   CREATININE 0.93 05/18/2022   BUN 18 05/18/2022   NA 141 05/18/2022   K 4.4 05/18/2022   CL 100 05/18/2022   CO2 25 05/18/2022   Lab Results  Component Value Date   ALT 53 (H) 05/18/2022   AST 36 05/18/2022   ALKPHOS 51 05/18/2022   BILITOT 0.5 05/18/2022   Lab Results  Component Value Date   HGBA1C 5.4 01/27/2022    HGBA1C 5.5 06/16/2021   HGBA1C 6.1 (H) 02/25/2021   Lab Results  Component Value Date   INSULIN 13.6 01/27/2022   INSULIN 12.2 06/16/2021   INSULIN 23.8 02/25/2021   Lab Results  Component Value Date   TSH 3.310 01/27/2022   Lab Results  Component Value Date   CHOL 172 01/27/2022   HDL 54 01/27/2022   LDLCALC 99 01/27/2022   TRIG 105 01/27/2022   CHOLHDL 3.9 02/25/2021   Lab Results  Component Value Date   VD25OH 63.8 01/27/2022   VD25OH 77.3 06/16/2021   VD25OH 25.7 (L) 02/25/2021   Lab Results  Component Value Date   WBC 6.0 06/16/2021   HGB 19.4 (H) 06/16/2021  HCT 58.7 (H) 06/16/2021   MCV 90 06/16/2021   PLT 222 06/16/2021   No results found for: "IRON", "TIBC", "FERRITIN"  Attestation Statements:   Reviewed by clinician on day of visit: allergies, medications, problem list, medical history, surgical history, family history, social history, and previous encounter notes.  Time spent on visit including pre-visit chart review and post-visit care and charting was 40 minutes.   Wilhemena Durie, am acting as transcriptionist for [employer], DO.   I have reviewed the above documentation for accuracy and completeness, and I agree with the above. Marjory Sneddon, D.O.  The [place] was signed into law in 2016 which includes the topic of electronic health records.  This provides immediate access to information in MyChart.  This includes consultation notes, operative notes, office notes, lab results and pathology reports.  If you have any questions about what you read please let us know at your next visit so we can discuss your concerns and take corrective action if need be.  We are right here with you.

## 2022-11-09 DIAGNOSIS — K649 Unspecified hemorrhoids: Secondary | ICD-10-CM | POA: Diagnosis not present

## 2022-11-09 DIAGNOSIS — D128 Benign neoplasm of rectum: Secondary | ICD-10-CM | POA: Diagnosis not present

## 2022-11-09 DIAGNOSIS — D124 Benign neoplasm of descending colon: Secondary | ICD-10-CM | POA: Diagnosis not present

## 2022-11-09 DIAGNOSIS — Z86010 Personal history of colonic polyps: Secondary | ICD-10-CM | POA: Diagnosis not present

## 2022-11-09 DIAGNOSIS — Z09 Encounter for follow-up examination after completed treatment for conditions other than malignant neoplasm: Secondary | ICD-10-CM | POA: Diagnosis not present

## 2022-11-09 DIAGNOSIS — K5730 Diverticulosis of large intestine without perforation or abscess without bleeding: Secondary | ICD-10-CM | POA: Diagnosis not present

## 2022-11-24 DIAGNOSIS — G4733 Obstructive sleep apnea (adult) (pediatric): Secondary | ICD-10-CM | POA: Diagnosis not present

## 2022-11-24 DIAGNOSIS — E782 Mixed hyperlipidemia: Secondary | ICD-10-CM | POA: Diagnosis not present

## 2022-11-24 DIAGNOSIS — I10 Essential (primary) hypertension: Secondary | ICD-10-CM | POA: Diagnosis not present

## 2022-12-06 DIAGNOSIS — Z5181 Encounter for therapeutic drug level monitoring: Secondary | ICD-10-CM | POA: Diagnosis not present

## 2022-12-26 DIAGNOSIS — G4733 Obstructive sleep apnea (adult) (pediatric): Secondary | ICD-10-CM | POA: Diagnosis not present

## 2023-01-20 DIAGNOSIS — G4733 Obstructive sleep apnea (adult) (pediatric): Secondary | ICD-10-CM | POA: Diagnosis not present

## 2023-01-26 ENCOUNTER — Ambulatory Visit (INDEPENDENT_AMBULATORY_CARE_PROVIDER_SITE_OTHER): Payer: BC Managed Care – PPO | Admitting: Family Medicine

## 2023-01-26 ENCOUNTER — Encounter (INDEPENDENT_AMBULATORY_CARE_PROVIDER_SITE_OTHER): Payer: Self-pay | Admitting: Family Medicine

## 2023-01-26 VITALS — BP 120/77 | HR 68 | Temp 97.5°F | Ht 69.0 in | Wt 197.0 lb

## 2023-01-26 DIAGNOSIS — E7849 Other hyperlipidemia: Secondary | ICD-10-CM

## 2023-01-26 DIAGNOSIS — G4739 Other sleep apnea: Secondary | ICD-10-CM | POA: Diagnosis not present

## 2023-01-26 DIAGNOSIS — E559 Vitamin D deficiency, unspecified: Secondary | ICD-10-CM | POA: Diagnosis not present

## 2023-01-26 DIAGNOSIS — G4733 Obstructive sleep apnea (adult) (pediatric): Secondary | ICD-10-CM | POA: Diagnosis not present

## 2023-01-26 DIAGNOSIS — R7303 Prediabetes: Secondary | ICD-10-CM | POA: Diagnosis not present

## 2023-01-26 DIAGNOSIS — Z6828 Body mass index (BMI) 28.0-28.9, adult: Secondary | ICD-10-CM

## 2023-01-26 DIAGNOSIS — J302 Other seasonal allergic rhinitis: Secondary | ICD-10-CM

## 2023-01-26 DIAGNOSIS — I10 Essential (primary) hypertension: Secondary | ICD-10-CM

## 2023-01-26 DIAGNOSIS — B002 Herpesviral gingivostomatitis and pharyngotonsillitis: Secondary | ICD-10-CM

## 2023-01-26 DIAGNOSIS — E669 Obesity, unspecified: Secondary | ICD-10-CM

## 2023-01-26 MED ORDER — LEVOCETIRIZINE DIHYDROCHLORIDE 5 MG PO TABS: 5.0000 mg | ORAL_TABLET | Freq: Every evening | ORAL | 0 refills | Status: DC

## 2023-01-26 MED ORDER — ACYCLOVIR 5 % EX OINT: 1.0000 | TOPICAL_OINTMENT | CUTANEOUS | 0 refills | Status: AC

## 2023-01-26 MED ORDER — METFORMIN HCL 500 MG PO TABS: ORAL_TABLET | ORAL | 0 refills | Status: DC

## 2023-01-26 MED ORDER — VITAMIN D (ERGOCALCIFEROL) 1.25 MG (50000 UNIT) PO CAPS
50000.0000 [IU] | ORAL_CAPSULE | ORAL | 0 refills | Status: DC
Start: 2023-01-26 — End: 2023-03-09

## 2023-01-26 MED ORDER — MONTELUKAST SODIUM 10 MG PO TABS
10.0000 mg | ORAL_TABLET | Freq: Every day | ORAL | 0 refills | Status: DC
Start: 2023-01-26 — End: 2023-03-09

## 2023-01-26 NOTE — Progress Notes (Signed)
Brett Wilcox, D.O.  ABFM, ABOM Specializing in Clinical Bariatric Medicine  Office located at: [address] [place], [place]  [address]     Assessment and Plan:   Orders Placed This Encounter  Procedures   Hemoglobin A1c   Insulin, random   Lipid panel   VITAMIN D 25 Hydroxy (Vit-D Deficiency, Fractures)   T4, free   TSH   Comprehensive metabolic panel    Medications Discontinued During This Encounter  Medication Reason   levocetirizine (XYZAL) 5 MG tablet Reorder   montelukast (SINGULAIR) 10 MG tablet Reorder   metFORMIN (GLUCOPHAGE) 500 MG tablet Reorder   Vitamin D, Ergocalciferol, (DRISDOL) 1.25 MG (50000 UNIT) CAPS capsule Reorder     Meds ordered this encounter  Medications   metFORMIN (GLUCOPHAGE) 500 MG tablet    Sig: 1 po with lunch daily and 2 po dinner daily    Dispense:  270 tablet    Refill:  0    90 d supply;  ** OV for RF **   Do not send RF request   Vitamin D, Ergocalciferol, (DRISDOL) 1.25 MG (50000 UNIT) CAPS capsule    Sig: Take 1 capsule (50,000 Units total) by mouth every 7 (seven) days.    Dispense:  12 capsule    Refill:  0   levocetirizine (XYZAL) 5 MG tablet    Sig: Take 1 tablet (5 mg total) by mouth every evening.    Dispense:  90 tablet    Refill:  0   montelukast (SINGULAIR) 10 MG tablet    Sig: Take 1 tablet (10 mg total) by mouth at bedtime.    Dispense:  90 tablet    Refill:  0   acyclovir ointment (ZOVIRAX) 5 %    Sig: Apply 1 Application topically every 3 (three) hours.    Dispense:  30 g    Refill:  0     Pre-diabetes Assessment: Condition is Improving, but not optimized..  Lab Results  Component Value Date   HGBA1C 5.4 01/27/2022   HGBA1C 5.5 06/16/2021   HGBA1C 6.1 (H) 02/25/2021   INSULIN 13.6 01/27/2022   INSULIN 12.2 06/16/2021   INSULIN 23.8 02/25/2021  - No issues with Metformin 500 mg BID, compliance good.  - Patient endorses snacking more after dinner.   Plan: - Recheck labs.  - Increased  Metformin to TID.  - Continue to decrease simple carbs/ sugars; increase fiber and proteins -> follow his meal plan.   [name] will continue to work on weight loss, exercise, via their meal plan we devised to help decrease the risk of progressing to diabetes.     Vitamin D deficiency Assessment: Condition is stable.  Lab Results  Component Value Date   VD25OH 63.8 01/27/2022   VD25OH 77.3 06/16/2021   VD25OH 25.7 (L) 02/25/2021  - He has been compliant with Ergocalciferol 50K IU weekly. Denies any side effects.  Plan:  - Recheck labs  - Continue with med. Will refill Ergocalciferol 50K IU weekly today.   - weight loss will likely improve availability of vitamin D, thus encouraged Deo to continue with meal plan and their weight loss efforts to further improve this condition.     Other sleep apnea Assessment: Condition is new and stable.  - Patient endorses having a Sleep Study with his PCP approximately one month ago and was diagnosed with Sleep Apnea. - He now uses a nasal CPAP nightly    Plan: - Continue with nightly CPAP  use as recommended by PCP.  - Continue his prudent nutritional plan that is low in simple carbohydrates, saturated fats and trans fats to goal of 5-10% weight loss to achieve significant health benefits.   - Will follow up on condition and treatment plan closely in conjunction with pt's PCP    Seasonal allergies Assessment: Condition is stable. - Patient's seasonal allergy symptoms have been controlled. - He reports good compliance and tolerance with Xyzal 5 mg daily, Singulair 10 mg daily, and Flonase every morning . Denies any side effects.   Plan: - Continue with meds as prescribed.  Will refill Xyzal and Singulair today.     Oral herpes simplex infection Assessment: Condition is stable. - Patient endorses getting fever blisters in the summertime. - Patient has used Acyclovir ointment in the past with great results and requests refill of this  today.   Plan: - Started Acyclovir 5% ointment. Risk/benefits of ointment discussed with patient.  - Will closely monitor and adapt treatment as needed.    Other hyperlipidemia Assessment: Condition is stable. Lab Results  Component Value Date   CHOL 172 01/27/2022   HDL 54 01/27/2022   LDLCALC 99 01/27/2022   TRIG 105 01/27/2022   CHOLHDL 3.9 02/25/2021  - No issues with Crestor 10 mg daily, compliance good.   Plan:  - Recheck labs - Continue with med as recommended by pcp/specialsit.  -  Continue with our prudent nutritional plan that is low in saturated and trans fats, and low in fatty carbs to improve these numbers.    Essential hypertension Assessment: Condition is stable. Last 3 blood pressure readings in our office are as follows: BP Readings from Last 3 Encounters:  01/26/23 120/77  10/20/22 124/81  08/10/22 123/81  - Blood pressure is stable today. - Patient reports good compliance and tolerance with Hydrodiuril 25 mg daily. Denies any side effects.  - Asymptomatic, no concerns.   Plan:  - Recheck labs - Continue with med as prescribed by specialist - Avoid buying foods that are: processed, frozen, or prepackaged to avoid excess salt.  TREATMENT PLAN FOR OBESITY: Obesity with current BMI of 28.5 Assessment: Condition is not optimized. Biometric data collected today, was reviewed with patient.  Fat mass has increased by 2.4 lb. Muscle mass has decreased by 1.8 lb. Total body water has increased by 1.8 lb.   Plan:  Issiac is currently in the action stage of change. As such, his goal is to continue weight management plan. Adar will work on healthier eating habits and continue with the Category 2 meal plan with breakfast and lunch options and with 6 oz of protein at lunch and 8-10 ounces of protein  Behavioral Intervention Additional resources provided today: category 2 meal plan information, breakfast options, and lunch options Evidence-based interventions  for health behavior change were utilized today including the discussion of self monitoring techniques, problem-solving barriers and SMART goal setting techniques.   Regarding patient's less desirable eating habits and patterns, we employed the technique of small changes.  Pt will specifically work on: continue prudent nutritional plan and exercise regiment  for next visit.    Recommended Physical Activity Goals Bransen has been advised to work up to 150 minutes of moderate intensity aerobic activity a week and strengthening exercises 2-3 times per week for cardiovascular health, weight loss maintenance and preservation of muscle mass.  He has agreed to Continue current level of physical activity   FOLLOW UP: Return in about 6 weeks (around 03/09/2023). He  was informed of the importance of frequent follow up visits to maximize his success with intensive lifestyle modifications for his multiple health conditions.  Brett Wilcox is aware that we will review all of his lab results at our next visit.  He is aware that if anything is critical/ life threatening with the results, we will be contacting him via MyChart prior to the office visit to discuss management.    Subjective:   Chief complaint: Obesity Jedidiah is here to discuss his progress with his obesity treatment plan. He is on the the Category 2 Plan with 6 oz of protein at lunch and 8-10 ounces of protein at dinner and states he is following his eating plan approximately 70% of the time. He states he is exercising 20 minutes 6 days per week.  Interval History:  ABDIRAHIM FLAVELL is here for a follow up office visit.  Since last office visit: - He endorses that for lunch he has frozen meals, sandwiches, or salads.   - He had a Sleep Study recently with his PCP and  was diagnosed with Sleep Apnea, he now uses nightly CPAP.  - He endorses drinking 6-7 bottles of water daily.   Pharmacotherapy for weight loss: He is currently taking   Metformin  for medical weight loss.  Denies side effects.    Review of Systems:  Pertinent positives were addressed with patient today.  Weight Summary and Biometrics   Weight Lost Since Last Visit: 0  Weight Gained Since Last Visit: 4 lb    Vitals Temp: (!) 97.5 F (36.4 C) BP: 120/77 Pulse Rate: 68 SpO2: 96 %   Anthropometric Measurements Height:  (1.753 m) Weight: 197 lb (89.4 kg) BMI (Calculated): 29.08 Weight at Last Visit: 193 lb Weight Lost Since Last Visit: 0 Weight Gained Since Last Visit: 4 lb Starting Weight: 226 lb Total Weight Loss (lbs): 29 lb (13.2 kg)   Body Composition  Body Fat %: 26.3 % Fat Mass (lbs): 52 lbs Muscle Mass (lbs): 138.2 lbs Total Body Water (lbs): 95.2 lbs Visceral Fat Rating : 13   Other Clinical Data Fasting: Yes Labs: Yes Today's Visit #: 24 Starting Date: 02/25/21     Objective:   PHYSICAL EXAM: Blood pressure 120/77, pulse 68, temperature (!) 97.5 F (36.4 C), height  (1.753 m), weight 197 lb (89.4 kg), SpO2 96 %. Body mass index is 29.09 kg/m.  General: Well Developed, well nourished, and in no acute distress.  HEENT: Normocephalic, atraumatic Skin: Warm and dry, cap RF less 2 sec, good turgor Chest:  Normal excursion, shape, no gross abn Respiratory: speaking in full sentences, no conversational dyspnea NeuroM-Sk: Ambulates w/o assistance, moves * 4 Psych: A and O *3, insight good, mood-full  DIAGNOSTIC DATA REVIEWED:  BMET    Component Value Date/Time   NA 141 05/18/2022 1520   K 4.4 05/18/2022 1520   CL 100 05/18/2022 1520   CO2 25 05/18/2022 1520   GLUCOSE 87 05/18/2022 1520   GLUCOSE 85 11/17/2017 1331   BUN 18 05/18/2022 1520   CREATININE 0.93 05/18/2022 1520   CALCIUM 10.1 05/18/2022 1520   Lab Results  Component Value Date   HGBA1C 5.4 01/27/2022   HGBA1C 6.1 (H) 02/25/2021   Lab Results  Component Value Date   INSULIN 13.6 01/27/2022   INSULIN 23.8 02/25/2021   Lab  Results  Component Value Date   TSH 3.310 01/27/2022   CBC    Component Value Date/Time   WBC  6.0 06/16/2021 0855   RBC 6.56 (H) 06/16/2021 0855   HGB 19.4 (H) 06/16/2021 0855   HCT 58.7 (H) 06/16/2021 0855   PLT 222 06/16/2021 0855   MCV 90 06/16/2021 0855   MCH 29.6 06/16/2021 0855   MCHC 33.0 06/16/2021 0855   RDW 14.7 06/16/2021 0855   Iron Studies No results found for: "IRON", "TIBC", "FERRITIN", "IRONPCTSAT" Lipid Panel     Component Value Date/Time   CHOL 172 01/27/2022 0941   TRIG 105 01/27/2022 0941   HDL 54 01/27/2022 0941   CHOLHDL 3.9 02/25/2021 0932   LDLCALC 99 01/27/2022 0941   Hepatic Function Panel     Component Value Date/Time   PROT 6.9 05/18/2022 1520   ALBUMIN 5.0 (H) 05/18/2022 1520   AST 36 05/18/2022 1520   ALT 53 (H) 05/18/2022 1520   ALKPHOS 51 05/18/2022 1520   BILITOT 0.5 05/18/2022 1520      Component Value Date/Time   TSH 3.310 01/27/2022 0941   Nutritional Lab Results  Component Value Date   VD25OH 63.8 01/27/2022   VD25OH 77.3 06/16/2021   VD25OH 25.7 (L) 02/25/2021    Attestations:   Reviewed by clinician on day of visit: allergies, medications, problem list, medical history, surgical history, family history, social history, and previous encounter notes.   Patient was in the office today and time spent on visit including pre-visit chart review and post-visit care/coordination of care and electronic medical record documentation was 40 minutes. 50% of that time was in face to face counseling of this patient's medical condition(s) and providing education on treatment options to always include the first-line treatment of diet and lifestyle modification.    I,Special Puri,acting as a Neurosurgeon for [employer], DO.,have documented all relevant documentation on the behalf of Thomasene Lot, DO,as directed by  Thomasene Lot, DO while in the presence of Thomasene Lot, DO.   I, Thomasene Lot, DO, have reviewed all  documentation for this visit. The documentation on 01/26/23 for the exam, diagnosis, procedures, and orders are all accurate and complete.

## 2023-01-27 LAB — COMPREHENSIVE METABOLIC PANEL
ALT: 51 IU/L — ABNORMAL HIGH (ref 0–44)
AST: 31 IU/L (ref 0–40)
Albumin/Globulin Ratio: 2.4 — ABNORMAL HIGH (ref 1.2–2.2)
Albumin: 5.1 g/dL — ABNORMAL HIGH (ref 3.8–4.9)
Alkaline Phosphatase: 64 IU/L (ref 44–121)
BUN/Creatinine Ratio: 16 (ref 9–20)
BUN: 14 mg/dL (ref 6–24)
Bilirubin Total: 0.6 mg/dL (ref 0.0–1.2)
CO2: 23 mmol/L (ref 20–29)
Calcium: 9.7 mg/dL (ref 8.7–10.2)
Chloride: 100 mmol/L (ref 96–106)
Creatinine, Ser: 0.87 mg/dL (ref 0.76–1.27)
Globulin, Total: 2.1 g/dL (ref 1.5–4.5)
Glucose: 102 mg/dL — ABNORMAL HIGH (ref 70–99)
Potassium: 4.3 mmol/L (ref 3.5–5.2)
Sodium: 141 mmol/L (ref 134–144)
Total Protein: 7.2 g/dL (ref 6.0–8.5)
eGFR: 102 mL/min/{1.73_m2} (ref >59)

## 2023-01-27 LAB — VITAMIN D 25 HYDROXY (VIT D DEFICIENCY, FRACTURES): Vit D, 25-Hydroxy: 61 ng/mL (ref 30.0–100.0)

## 2023-01-27 LAB — LIPID PANEL
Chol/HDL Ratio: 3 ratio (ref 0.0–5.0)
Cholesterol, Total: 155 mg/dL (ref 100–199)
HDL: 52 mg/dL (ref >39)
LDL Chol Calc (NIH): 82 mg/dL (ref 0–99)
Triglycerides: 119 mg/dL (ref 0–149)
VLDL Cholesterol Cal: 21 mg/dL (ref 5–40)

## 2023-01-27 LAB — T4, FREE: Free T4: 1.67 ng/dL (ref 0.82–1.77)

## 2023-01-27 LAB — HEMOGLOBIN A1C
Est. average glucose Bld gHb Est-mCnc: 108 mg/dL
Hgb A1c MFr Bld: 5.4 % (ref 4.8–5.6)

## 2023-01-27 LAB — TSH: TSH: 2.5 u[IU]/mL (ref 0.450–4.500)

## 2023-01-27 LAB — INSULIN, RANDOM: INSULIN: 6.3 u[IU]/mL (ref 2.6–24.9)

## 2023-01-30 DIAGNOSIS — I10 Essential (primary) hypertension: Secondary | ICD-10-CM | POA: Diagnosis not present

## 2023-01-30 DIAGNOSIS — J310 Chronic rhinitis: Secondary | ICD-10-CM | POA: Diagnosis not present

## 2023-01-30 DIAGNOSIS — E8881 Metabolic syndrome: Secondary | ICD-10-CM | POA: Diagnosis not present

## 2023-01-30 DIAGNOSIS — G4733 Obstructive sleep apnea (adult) (pediatric): Secondary | ICD-10-CM | POA: Diagnosis not present

## 2023-02-25 DIAGNOSIS — G4733 Obstructive sleep apnea (adult) (pediatric): Secondary | ICD-10-CM | POA: Diagnosis not present

## 2023-03-02 DIAGNOSIS — I10 Essential (primary) hypertension: Secondary | ICD-10-CM | POA: Diagnosis not present

## 2023-03-02 DIAGNOSIS — G4733 Obstructive sleep apnea (adult) (pediatric): Secondary | ICD-10-CM | POA: Diagnosis not present

## 2023-03-02 DIAGNOSIS — E782 Mixed hyperlipidemia: Secondary | ICD-10-CM | POA: Diagnosis not present

## 2023-03-09 ENCOUNTER — Encounter (INDEPENDENT_AMBULATORY_CARE_PROVIDER_SITE_OTHER): Payer: Self-pay | Admitting: Family Medicine

## 2023-03-09 ENCOUNTER — Ambulatory Visit (INDEPENDENT_AMBULATORY_CARE_PROVIDER_SITE_OTHER): Payer: BC Managed Care – PPO | Admitting: Family Medicine

## 2023-03-09 VITALS — BP 125/86 | HR 78 | Temp 97.9°F | Ht 69.0 in | Wt 199.0 lb

## 2023-03-09 DIAGNOSIS — E7849 Other hyperlipidemia: Secondary | ICD-10-CM

## 2023-03-09 DIAGNOSIS — E6609 Other obesity due to excess calories: Secondary | ICD-10-CM

## 2023-03-09 DIAGNOSIS — E559 Vitamin D deficiency, unspecified: Secondary | ICD-10-CM

## 2023-03-09 DIAGNOSIS — R7303 Prediabetes: Secondary | ICD-10-CM

## 2023-03-09 DIAGNOSIS — J302 Other seasonal allergic rhinitis: Secondary | ICD-10-CM

## 2023-03-09 DIAGNOSIS — Z6829 Body mass index (BMI) 29.0-29.9, adult: Secondary | ICD-10-CM

## 2023-03-09 MED ORDER — TIRZEPATIDE-WEIGHT MANAGEMENT 2.5 MG/0.5ML ~~LOC~~ SOAJ
2.5000 mg | SUBCUTANEOUS | 0 refills | Status: DC
Start: 2023-03-09 — End: 2023-04-13

## 2023-03-09 MED ORDER — LEVOCETIRIZINE DIHYDROCHLORIDE 5 MG PO TABS: 5.0000 mg | ORAL_TABLET | Freq: Every evening | ORAL | 0 refills | Status: DC

## 2023-03-09 MED ORDER — MONTELUKAST SODIUM 10 MG PO TABS: 10.0000 mg | ORAL_TABLET | Freq: Every day | ORAL | 0 refills | Status: DC

## 2023-03-09 MED ORDER — VITAMIN D (ERGOCALCIFEROL) 1.25 MG (50000 UNIT) PO CAPS
50000.0000 [IU] | ORAL_CAPSULE | ORAL | 0 refills | Status: AC
Start: 2023-03-09 — End: ?

## 2023-03-09 NOTE — Progress Notes (Signed)
Brett Wilcox, D.O.  ABFM, ABOM Specializing in Clinical Bariatric Medicine  Office located at: [address] [place], [place]  [address]     Assessment and Plan:   Medications Discontinued During This Encounter  Medication Reason   Vitamin D, Ergocalciferol, (DRISDOL) 1.25 MG (50000 UNIT) CAPS capsule Reorder   levocetirizine (XYZAL) 5 MG tablet Reorder   montelukast (SINGULAIR) 10 MG tablet Reorder     Meds ordered this encounter  Medications   levocetirizine (XYZAL) 5 MG tablet    Sig: Take 1 tablet (5 mg total) by mouth every evening.    Dispense:  90 tablet    Refill:  0   Vitamin D, Ergocalciferol, (DRISDOL) 1.25 MG (50000 UNIT) CAPS capsule    Sig: Take 1 capsule (50,000 Units total) by mouth every 7 (seven) days.    Dispense:  12 capsule    Refill:  0   montelukast (SINGULAIR) 10 MG tablet    Sig: Take 1 tablet (10 mg total) by mouth at bedtime.    Dispense:  90 tablet    Refill:  0   tirzepatide (ZEPBOUND) 2.5 MG/0.5ML Pen    Sig: Inject 2.5 mg into the skin once a week.    Dispense:  2 mL    Refill:  0     Repeat IC next OV   Seasonal allergies Assessment: Condition is stable. Pt takes  Xyzal 5 mg daily and Singulair 10 mg daily, which has improved symptoms and his quality of life.Tolerating meds well, denies concerns.   Plan: Continue with allergy meds. Will refill Xyzal and Singulair today. Continue with low inflammatory meal plan.    Other hyperlipidemia Assessment: Condition is stable. Labs indicate that his components of cholesterol are all within normal limits. No concerns. Discussed with patient that exercise can help improve his HDL levels.   Pt has been compliant and tolerant with Crestor 10 mg daily. Denies any side effects. Labs were reviewed with patient today and all of the patient's questions about them were answered    Lab Results  Component Value Date   CHOL 155 01/26/2023   HDL 52 01/26/2023   LDLCALC 82 01/26/2023   TRIG  119 01/26/2023   CHOLHDL 3.0 01/26/2023   Plan: Brett Wilcox agrees to continue with med as recommended by PCP/specialist and our  treatment plan of a heart-heathy, low cholesterol meal plan. We will continue routine screening as patient continues to achieve health goals along their weight loss journey     Pre-diabetes Assessment: Condition is improving. Labs were reviewed with patient today and education provided on them.  All of the patient's questions about them were answered    - Patient has not noticed any difference in his hunger and cravings since I increased his Metformin to TID on 01/26/2023.  - Labs below indicate that:  - His A1c levels have been stable since 01/27/2022.  - His Insulin levels have improved from 13.6 on 01/27/22 to 6.3 on 01/26/23.   - Labs are indicative of stable kidney and liver function.   - His thyroid function is stable.    Lab Results  Component Value Date   HGBA1C 5.4 01/26/2023   HGBA1C 5.4 01/27/2022   HGBA1C 5.5 06/16/2021   INSULIN 6.3 01/26/2023   INSULIN 13.6 01/27/2022   INSULIN 12.2 06/16/2021   Lab Results  Component Value Date   CREATININE 0.87 01/26/2023   BUN 14 01/26/2023   NA 141 01/26/2023   K  4.3 01/26/2023   CL 100 01/26/2023   CO2 23 01/26/2023      Component Value Date/Time   PROT 7.2 01/26/2023 0919   ALBUMIN 5.1 (H) 01/26/2023 0919   AST 31 01/26/2023 0919   ALT 51 (H) 01/26/2023 0919   ALKPHOS 64 01/26/2023 0919   BILITOT 0.6 01/26/2023 0919   Lab Results  Component Value Date   TSH 2.500 01/26/2023   FREET4 1.67 01/26/2023    Plan: I instructed patient to discontinue the Metformin if he is able to obtain and start his Zepbound.   - Educated patient that having protein with each meal is important for controlling hunger and cravings and increasing metabolism.   Brett Wilcox will continue to work on weight loss, exercise, via their meal plan we devised to help decrease the risk of progressing to diabetes.  Will continue to monitor closely alongside PCP.    Vitamin D deficiency Assessment: Condition is stable. Labs indicate that his Vitamin D levels are within the recommended normal limits of 50-80. No issues with Ergocalciferol 50K IU weekly. Denies any adverse effects. Labs were reviewed with patient today and all of the patient's questions about them were answered    Lab Results  Component Value Date   VD25OH 61.0 01/26/2023   VD25OH 63.8 01/27/2022   VD25OH 77.3 06/16/2021   Plan: Continue with OTC supplement. Will reorder this today.  Will continue to monitor levels regularly (every 3-4 mo on average) to keep levels within normal limits and prevent over supplementation.   TREATMENT PLAN FOR OBESITY: Class 1 obesity due to excess calories with serious comorbidity and body mass index (BMI) of 33.0 to 33.9 in adult Assessment:  Brett Wilcox is here to discuss his progress with his obesity treatment plan along with follow-up of his obesity related diagnoses. See Medical Weight Management Flowsheet for complete bioelectrical impedance results.  Condition is not optimized.. Biometric data collected today, was reviewed with patient.   Since last office visit on 01/26/23 patient's  Muscle mass has increased by 0.8 lb. Fat mass has increased by 0.8 lb. Total body water has increased by 1.2 lb.  Counseling done on how various foods will affect these numbers and how to maximize success  Total lbs lost to date: 27  Total weight loss percentage to date: 11.95   Plan:  - Continue with the Category 2 meal plan with breakfast and lunch options and with 6 oz of protein at lunch and 8-10 ounces of protein   - Begin Zepbound. Patient denies a personal history of pancreatitis;  and denies a family history of medullary thyroid carcinoma or multiple endocrine neoplasia type II.  I informed the patient that the demand for Zepbound is high and supply is low, therefore the medication can be difficult to  obtain.  Strategies to obtain discussed with patient.  Potential risks/ benefits of the medication were explained to patient. Explained that the more he eats "off-plan", the more likely for him to have side effects of the drug.  All questions were answered appropriately and alternative treatment options were discussed; patient wishes to move forward with the medication at this time.   Behavioral Intervention Additional resources provided today: category 1 meal plan information Evidence-based interventions for health behavior change were utilized today including the discussion of self monitoring techniques, problem-solving barriers and SMART goal setting techniques.   Regarding patient's less desirable eating habits and patterns, we employed the technique of small changes.  Pt will specifically work  on: continue adherence to prudent nutritional plan for next visit.    Recommended Physical Activity Goals  Brett Wilcox has been advised to slowly work up to 150 minutes of moderate intensity aerobic activity a week and strengthening exercises 2-3 times per week for cardiovascular health, weight loss maintenance and preservation of muscle mass.   He has agreed to Continue current level of physical activity   FOLLOW UP: Return in about 4 weeks (around 04/06/2023). He was informed of the importance of frequent follow up visits to maximize his success with intensive lifestyle modifications for his multiple health conditions.   Subjective:   Chief complaint: Obesity Brett Wilcox is here to discuss his progress with his obesity treatment plan. He is on the the Category 2 Plan with breakfast and lunch options and with 6 ounces of lean protein at lunch and 8-10 ounces of protein and states he is following his eating plan approximately 70% of the time. He states he is exercising (tennis, push-ups, sit-ups)  30-60 minutes 3-5 days per week.  Interval History:  Brett Wilcox is here for a follow up office visit.      Since last office visit:    - Patient desires to be 170-180 lbs.   - He typically follows the meal plan for breakfast and lunch, however not for dinner.   - Pt has not noticed any decrease in his hunger and cravings since I increased the Metformin to TID.   Pharmacotherapy for weight loss: He is currently taking  Metformin  for medical weight loss.  Denies side effects.    Review of Systems:  Pertinent positives were addressed with patient today.  Weight Summary and Biometrics   Weight Lost Since Last Visit: 0lb  Weight Gained Since Last Visit: 2lb    Vitals Temp: 97.9 F (36.6 C) BP: 125/86 Pulse Rate: 78 SpO2: 96 %   Anthropometric Measurements Height: 5\' 9"  (1.753 m) Weight: 199 lb (90.3 kg) BMI (Calculated): 29.37 Weight at Last Visit: 197lb Weight Lost Since Last Visit: 0lb Weight Gained Since Last Visit: 2lb Starting Weight: 226lb Total Weight Loss (lbs): 27 lb (12.2 kg)   Body Composition  Body Fat %: 26.5 % Fat Mass (lbs): 52.8 lbs Muscle Mass (lbs): 139 lbs Total Body Water (lbs): 96.4 lbs Visceral Fat Rating : 13   Other Clinical Data Fasting: no Labs: no Today's Visit #: 25 Starting Date: 02/25/21     Objective:   PHYSICAL EXAM: Blood pressure 125/86, pulse 78, temperature 97.9 F (36.6 C), height 5\' 9"  (1.753 m), weight 199 lb (90.3 kg), SpO2 96 %. Body mass index is 29.39 kg/m.  General: Well Developed, well nourished, and in no acute distress.  HEENT: Normocephalic, atraumatic Skin: Warm and dry, cap RF less 2 sec, good turgor Chest:  Normal excursion, shape, no gross abn Respiratory: speaking in full sentences, no conversational dyspnea NeuroM-Sk: Ambulates w/o assistance, moves * 4 Psych: A and O *3, insight good, mood-full  DIAGNOSTIC DATA REVIEWED:  BMET    Component Value Date/Time   NA 141 01/26/2023 0919   K 4.3 01/26/2023 0919   CL 100 01/26/2023 0919   CO2 23 01/26/2023 0919   GLUCOSE 102 (H) 01/26/2023 0919    GLUCOSE 85 11/17/2017 1331   BUN 14 01/26/2023 0919   CREATININE 0.87 01/26/2023 0919   CALCIUM 9.7 01/26/2023 0919   Lab Results  Component Value Date   HGBA1C 5.4 01/26/2023   HGBA1C 6.1 (H) 02/25/2021   Lab Results  Component  Value Date   INSULIN 6.3 01/26/2023   INSULIN 23.8 02/25/2021   Lab Results  Component Value Date   TSH 2.500 01/26/2023   CBC    Component Value Date/Time   WBC 6.0 06/16/2021 0855   RBC 6.56 (H) 06/16/2021 0855   HGB 19.4 (H) 06/16/2021 0855   HCT 58.7 (H) 06/16/2021 0855   PLT 222 06/16/2021 0855   MCV 90 06/16/2021 0855   MCH 29.6 06/16/2021 0855   MCHC 33.0 06/16/2021 0855   RDW 14.7 06/16/2021 0855   Iron Studies No results found for: "IRON", "TIBC", "FERRITIN", "IRONPCTSAT" Lipid Panel     Component Value Date/Time   CHOL 155 01/26/2023 0919   TRIG 119 01/26/2023 0919   HDL 52 01/26/2023 0919   CHOLHDL 3.0 01/26/2023 0919   LDLCALC 82 01/26/2023 0919   Hepatic Function Panel     Component Value Date/Time   PROT 7.2 01/26/2023 0919   ALBUMIN 5.1 (H) 01/26/2023 0919   AST 31 01/26/2023 0919   ALT 51 (H) 01/26/2023 0919   ALKPHOS 64 01/26/2023 0919   BILITOT 0.6 01/26/2023 0919      Component Value Date/Time   TSH 2.500 01/26/2023 0919   Nutritional Lab Results  Component Value Date   VD25OH 61.0 01/26/2023   VD25OH 63.8 01/27/2022   VD25OH 77.3 06/16/2021    Attestations:   Reviewed by clinician on day of visit: allergies, medications, problem list, medical history, surgical history, family history, social history, and previous encounter notes.   Patient was in the office today and time spent on visit including pre-visit chart review and post-visit care/coordination of care and electronic medical record documentation was  40 minutes. 50% of that time was in face to face counseling of this patient's medical condition(s) and providing education on treatment options to always include the first-line treatment of diet  and lifestyle modification.    I,Special Puri,acting as a Neurosurgeon for [employer], DO.,have documented all relevant documentation on the behalf of Thomasene Lot, DO,as directed by  Thomasene Lot, DO while in the presence of Thomasene Lot, DO.   I, Thomasene Lot, DO, have reviewed all documentation for this visit. The documentation on 03/09/23 for the exam, diagnosis, procedures, and orders are all accurate and complete.

## 2023-03-28 DIAGNOSIS — G4733 Obstructive sleep apnea (adult) (pediatric): Secondary | ICD-10-CM | POA: Diagnosis not present

## 2023-03-31 DIAGNOSIS — G4733 Obstructive sleep apnea (adult) (pediatric): Secondary | ICD-10-CM | POA: Diagnosis not present

## 2023-04-13 ENCOUNTER — Other Ambulatory Visit (HOSPITAL_COMMUNITY): Payer: Self-pay

## 2023-04-13 ENCOUNTER — Encounter (INDEPENDENT_AMBULATORY_CARE_PROVIDER_SITE_OTHER): Payer: Self-pay | Admitting: Family Medicine

## 2023-04-13 ENCOUNTER — Ambulatory Visit (INDEPENDENT_AMBULATORY_CARE_PROVIDER_SITE_OTHER): Payer: BC Managed Care – PPO | Admitting: Family Medicine

## 2023-04-13 VITALS — BP 125/85 | HR 67 | Temp 97.7°F | Ht 69.0 in | Wt 196.0 lb

## 2023-04-13 DIAGNOSIS — J302 Other seasonal allergic rhinitis: Secondary | ICD-10-CM | POA: Diagnosis not present

## 2023-04-13 DIAGNOSIS — E559 Vitamin D deficiency, unspecified: Secondary | ICD-10-CM

## 2023-04-13 DIAGNOSIS — E6609 Other obesity due to excess calories: Secondary | ICD-10-CM

## 2023-04-13 DIAGNOSIS — R0602 Shortness of breath: Secondary | ICD-10-CM

## 2023-04-13 DIAGNOSIS — R7303 Prediabetes: Secondary | ICD-10-CM | POA: Diagnosis not present

## 2023-04-13 DIAGNOSIS — Z6828 Body mass index (BMI) 28.0-28.9, adult: Secondary | ICD-10-CM

## 2023-04-13 DIAGNOSIS — E66811 Obesity, class 1: Secondary | ICD-10-CM

## 2023-04-13 MED ORDER — VITAMIN D (ERGOCALCIFEROL) 1.25 MG (50000 UNIT) PO CAPS
50000.0000 [IU] | ORAL_CAPSULE | ORAL | 0 refills | Status: DC
Start: 2023-04-13 — End: 2023-06-19

## 2023-04-13 MED ORDER — MONTELUKAST SODIUM 10 MG PO TABS: 10.0000 mg | ORAL_TABLET | Freq: Every day | ORAL | 0 refills | Status: DC

## 2023-04-13 MED ORDER — TIRZEPATIDE-WEIGHT MANAGEMENT 5 MG/0.5ML ~~LOC~~ SOAJ
5.0000 mg | SUBCUTANEOUS | 0 refills | Status: DC
  Filled 2023-04-13: qty 2, 28d supply, fill #0

## 2023-04-13 MED ORDER — LEVOCETIRIZINE DIHYDROCHLORIDE 5 MG PO TABS: 5.0000 mg | ORAL_TABLET | Freq: Every evening | ORAL | 0 refills | Status: DC

## 2023-04-13 NOTE — Progress Notes (Signed)
[name], D.O.  ABFM, ABOM Specializing in Clinical Bariatric Medicine  Office located at: [address] [place], [place]  [address]     Assessment and Plan:   Medications Discontinued During This Encounter  Medication Reason   metFORMIN (GLUCOPHAGE) 500 MG tablet    tirzepatide (ZEPBOUND) 2.5 MG/0.5ML Pen    levocetirizine (XYZAL) 5 MG tablet Reorder   Vitamin D, Ergocalciferol, (DRISDOL) 1.25 MG (50000 UNIT) CAPS capsule Reorder   montelukast (SINGULAIR) 10 MG tablet Reorder     Meds ordered this encounter  Medications   Vitamin D, Ergocalciferol, (DRISDOL) 1.25 MG (50000 UNIT) CAPS capsule    Sig: Take 1 capsule (50,000 Units total) by mouth every 7 (seven) days.    Dispense:  12 capsule    Refill:  0   montelukast (SINGULAIR) 10 MG tablet    Sig: Take 1 tablet (10 mg total) by mouth at bedtime.    Dispense:  90 tablet    Refill:  0   levocetirizine (XYZAL) 5 MG tablet    Sig: Take 1 tablet (5 mg total) by mouth every evening.    Dispense:  90 tablet    Refill:  0   tirzepatide (ZEPBOUND) 5 MG/0.5ML Pen    Sig: Inject 5 mg into the skin once a week.    Dispense:  2 mL    Refill:  0    PT PAYING CASH AND PLEASE APPLY DISCOUNT/ SAVINGS CARD FOR APPROX  $550.00/ MO     SOB (shortness of breath) on exertion [R06.02] Assessment: Condition is stable. Counseling done with patient regarding repeat I.C. results. His REE is 2074.  His calculated basal metabolic rate is 6045. thus his measured basal metabolic rate is better than anticipated.   Plan: Reviewed aspects of diet and lifestyle today with patient that will increase metabolism.  All questions addressed.  Continue to focus on lean proteins, adequate nutrition and advancing cardiovascular and resistance training as tolerated.     Vitamin D deficiency Assessment: This condition is being treated with Ergocalciferol 50K IU weekly. He is tolerating supplement well and denies any side effects.   Lab Results   Component Value Date   VD25OH 61.0 01/26/2023   VD25OH 63.8 01/27/2022   VD25OH 77.3 06/16/2021   Plan: Continue with meal plan and their weight loss efforts to further improve this condition. Continue with prescribed supplement at current dose. Will refill ERGO today. Thus, we will need to monitor levels regularly (every 3-4 mo on average) to keep levels within normal limits and prevent over supplementation   Pre-diabetes Assessment: His prediabetes is being treated with Zepbound 2.5 mg once weekly and he is responding to medication quite well. He denies having any nausea, but endorses experiencing slight constipation. He reports that the Zepbound is not hindering his ability to eat all the foods on his meal plan.   Lab Results  Component Value Date   HGBA1C 5.4 01/26/2023   HGBA1C 5.4 01/27/2022   HGBA1C 5.5 06/16/2021   INSULIN 6.3 01/26/2023   INSULIN 13.6 01/27/2022   INSULIN 12.2 06/16/2021    Plan: I feel that going up on Zepbound will further aid Brett Wilcox with weight loss; pt concurs. Therefore, I will increase Zepbound to 5 mg once weekly. Reminded pt that the more he eats "off-plan", the more likely for him to have side effects of the drug.  All questions were answered appropriately ; patient wishes to move forward. Continue to decrease simple carbs/ sugars;  increase fiber and proteins -> follow his meal plan. Will continue to monitor condition alongside PCP.   Seasonal allergies Assessment: Brett Wilcox has seasonal allergies that have been more bothersome with the change of weather/season.   Pt takes Xyzal 5 mg daily and Singular 10 mg daily, which has improved symptoms and his quality of life. Tolerating well, denies concerns.   Plan: Pt advised to maintain with low inflammatory meal plan and continue with both allergy meds. Will refill Xyzal and Singulair today. I will continue to monitor condition as deemed clinically necessary.    TREATMENT PLAN FOR OBESITY: BMI  28.0-28.9,adult- current bmi 29.08 Class 1 obesity due to excess calories with serious comorbidity and body mass index (BMI) of 33.0 to 33.9 in adult Assessment: Brett Wilcox is here to discuss his progress with his obesity treatment plan along with follow-up of his obesity related diagnoses. See Medical Weight Management Flowsheet for complete bioelectrical impedance results.  Condition is improving, but not optimized. Biometric data collected today, was reviewed with patient.   Since last office visit on 02/21/22 patient's Muscle mass has decreased by 1.4 lb. Fat mass has decreased by 1.2 lb. Total body water has decreased by 1.8 lb.  Counseling done on how various foods will affect these numbers and how to maximize success  Total lbs lost to date: 29  Total weight loss percentage to date: 12.83   Plan: Continue with the Category 2 meal plan with breakfast and lunch options and with 6 oz of protein at lunch and 8-10 ounces of protein   - I recommended Brett Wilcox to try PB2 powder, which is a healthier alternative to regular peanut butter.   Behavioral Intervention Additional resources provided today: category 2 meal plan information Evidence-based interventions for health behavior change were utilized today including the discussion of self monitoring techniques, problem-solving barriers and SMART goal setting techniques.   Regarding patient's less desirable eating habits and patterns, we employed the technique of small changes.  Pt will specifically work on: increasing lean protein intake and doing weight lifting  for next visit.    Recommended Physical Activity Goals  Brett Wilcox has been advised to slowly work up to 150 minutes of moderate intensity aerobic activity a week and strengthening exercises 2-3 times per week for cardiovascular health, weight loss maintenance and preservation of muscle mass.   He has agreed to Continue current level of physical activity   FOLLOW UP: Return in  about 4 weeks (around 05/11/2023). He was informed of the importance of frequent follow up visits to maximize his success with intensive lifestyle modifications for his multiple health conditions.  Subjective:   Chief complaint: Obesity Brett Wilcox is here to discuss his progress with his obesity treatment plan. He is on the Category 2 Plan with b/l options and with 6 oz of lean protein at lunch and 8-10 oz at dinner and states he is following his eating plan approximately 70% of the time. He states he is doing strength training 15 minutes, 5 days a wk and tennis/pickle ball 90 minutes 2 days a wk.   Interval History:  Brett Wilcox is here for a follow up office visit. Since last OV, Brett Wilcox has been doing well. He started Zepbound 2.5 mg and apart from having slight constipation, he is tolerating medication quite well. He follows the meal plan strictly for breakfast and lunch, but admits to sometimes deviating from the plan during dinner. He also states eating some off plan foods over the 4th of  July weekend.  Pharmacotherapy for weight loss: He is currently taking  Zepbound 2.5 mg once weekly  for medical weight loss.    Review of Systems:  Pertinent positives were addressed with patient today.  Reviewed by clinician on day of visit: allergies, medications, problem list, medical history, surgical history, family history, social history, and previous encounter notes.  Weight Summary and Biometrics   Weight Lost Since Last Visit: 3lb  Weight Gained Since Last Visit: 0lb   Vitals Temp: 97.7 F (36.5 C) BP: 125/85 Pulse Rate: 67 SpO2: 96 %   Anthropometric Measurements Height: 5\' 9"  (1.753 m) Weight: 196 lb (88.9 kg) BMI (Calculated): 28.93 Weight at Last Visit: 199lb Weight Lost Since Last Visit: 3lb Weight Gained Since Last Visit: 0lb Starting Weight: 226lb Total Weight Loss (lbs): 30 lb (13.6 kg)   Body Composition  Body Fat %: 26.3 % Fat Mass (lbs): 51.6 lbs Muscle Mass  (lbs): 137.6 lbs Total Body Water (lbs): 94.6 lbs Visceral Fat Rating : 13   Other Clinical Data RMR: [id number] Fasting: yes Labs: no Today's Visit #: 26 Starting Date: 02/25/21   Objective:   PHYSICAL EXAM: Blood pressure 125/85, pulse 67, temperature 97.7 F (36.5 C), height 5\' 9"  (1.753 m), weight 196 lb (88.9 kg), SpO2 96%. Body mass index is 28.94 kg/m.  General: Well Developed, well nourished, and in no acute distress.  HEENT: Normocephalic, atraumatic Skin: Warm and dry, cap RF less 2 sec, good turgor Chest:  Normal excursion, shape, no gross abn Respiratory: speaking in full sentences, no conversational dyspnea NeuroM-Sk: Ambulates w/o assistance, moves * 4 Psych: A and O *3, insight good, mood-full  DIAGNOSTIC DATA REVIEWED:  BMET    Component Value Date/Time   NA 141 01/26/2023 0919   K 4.3 01/26/2023 0919   CL 100 01/26/2023 0919   CO2 23 01/26/2023 0919   GLUCOSE 102 (H) 01/26/2023 0919   GLUCOSE 85 11/17/2017 1331   BUN 14 01/26/2023 0919   CREATININE 0.87 01/26/2023 0919   CALCIUM 9.7 01/26/2023 0919   Lab Results  Component Value Date   HGBA1C 5.4 01/26/2023   HGBA1C 6.1 (H) 02/25/2021   Lab Results  Component Value Date   INSULIN 6.3 01/26/2023   INSULIN 23.8 02/25/2021   Lab Results  Component Value Date   TSH 2.500 01/26/2023   CBC    Component Value Date/Time   WBC 6.0 06/16/2021 0855   RBC 6.56 (H) 06/16/2021 0855   HGB 19.4 (H) 06/16/2021 0855   HCT 58.7 (H) 06/16/2021 0855   PLT 222 06/16/2021 0855   MCV 90 06/16/2021 0855   MCH 29.6 06/16/2021 0855   MCHC 33.0 06/16/2021 0855   RDW 14.7 06/16/2021 0855   Iron Studies No results found for: "IRON", "TIBC", "FERRITIN", "IRONPCTSAT" Lipid Panel     Component Value Date/Time   CHOL 155 01/26/2023 0919   TRIG 119 01/26/2023 0919   HDL 52 01/26/2023 0919   CHOLHDL 3.0 01/26/2023 0919   LDLCALC 82 01/26/2023 0919   Hepatic Function Panel     Component Value Date/Time    PROT 7.2 01/26/2023 0919   ALBUMIN 5.1 (H) 01/26/2023 0919   AST 31 01/26/2023 0919   ALT 51 (H) 01/26/2023 0919   ALKPHOS 64 01/26/2023 0919   BILITOT 0.6 01/26/2023 0919      Component Value Date/Time   TSH 2.500 01/26/2023 0919   Nutritional Lab Results  Component Value Date   VD25OH 61.0 01/26/2023   VD25OH 63.8  01/27/2022   VD25OH 77.3 06/16/2021    Attestations:   I, Special Puri , acting as a Stage manager for Thomasene Lot, DO., have compiled all relevant documentation for today's office visit on behalf of Thomasene Lot, DO, while in the presence of [employer], DO.  I have reviewed the above documentation for accuracy and completeness, and I agree with the above. [name], D.O.  The 21st Century Cures Act was signed into law in 2016 which includes the topic of electronic health records.  This provides immediate access to information in MyChart.  This includes consultation notes, operative notes, office notes, lab results and pathology reports.  If you have any questions about what you read please let us know at your next visit so we can discuss your concerns and take corrective action if need be.  We are right here with you.

## 2023-05-10 ENCOUNTER — Encounter (INDEPENDENT_AMBULATORY_CARE_PROVIDER_SITE_OTHER): Payer: Self-pay | Admitting: Family Medicine

## 2023-05-10 ENCOUNTER — Other Ambulatory Visit (HOSPITAL_COMMUNITY): Payer: Self-pay

## 2023-05-10 ENCOUNTER — Ambulatory Visit (INDEPENDENT_AMBULATORY_CARE_PROVIDER_SITE_OTHER): Payer: BC Managed Care – PPO | Admitting: Family Medicine

## 2023-05-10 VITALS — BP 119/83 | HR 68 | Temp 98.0°F | Ht 69.0 in | Wt 191.0 lb

## 2023-05-10 DIAGNOSIS — E559 Vitamin D deficiency, unspecified: Secondary | ICD-10-CM | POA: Diagnosis not present

## 2023-05-10 DIAGNOSIS — R7303 Prediabetes: Secondary | ICD-10-CM

## 2023-05-10 DIAGNOSIS — E6609 Other obesity due to excess calories: Secondary | ICD-10-CM

## 2023-05-10 DIAGNOSIS — J302 Other seasonal allergic rhinitis: Secondary | ICD-10-CM

## 2023-05-10 DIAGNOSIS — Z6828 Body mass index (BMI) 28.0-28.9, adult: Secondary | ICD-10-CM

## 2023-05-10 MED ORDER — TIRZEPATIDE-WEIGHT MANAGEMENT 5 MG/0.5ML ~~LOC~~ SOAJ: 5.0000 mg | SUBCUTANEOUS | 1 refills | Status: DC

## 2023-05-10 MED ORDER — TIRZEPATIDE-WEIGHT MANAGEMENT 5 MG/0.5ML ~~LOC~~ SOAJ
5.0000 mg | SUBCUTANEOUS | 0 refills | Status: DC
  Filled 2023-05-10: qty 2, 28d supply, fill #0

## 2023-05-10 NOTE — Progress Notes (Signed)
Brett Wilcox, D.O.  ABFM, ABOM Specializing in Clinical Bariatric Medicine  Office located at: [address] [place], [place]  [address]     Assessment and Plan:   Medications Discontinued During This Encounter  Medication Reason   tirzepatide (ZEPBOUND) 5 MG/0.5ML Pen Reorder   tirzepatide (ZEPBOUND) 5 MG/0.5ML Pen     Meds ordered this encounter  Medications   DISCONTD: tirzepatide (ZEPBOUND) 5 MG/0.5ML Pen    Sig: Inject 5 mg into the skin once a week.    Dispense:  2 mL    Refill:  0    PT PAYING CASH AND PLEASE APPLY DISCOUNT/ SAVINGS CARD FOR APPROX  $550.00/ MO   tirzepatide (ZEPBOUND) 5 MG/0.5ML Pen    Sig: Inject 5 mg into the skin once a week.    Dispense:  2 mL    Refill:  1    PT PAYING CASH AND PLEASE APPLY DISCOUNT/ SAVINGS CARD FOR APPROX  $550.00/ MO   Vitamin D deficiency Assessment: Condition is Controlled.. He has been compliant with taking Ergocalciferol weekly and denies any adverse side effects. He tolerates this well at this time.  Lab Results  Component Value Date   VD25OH 61.0 01/26/2023   VD25OH 63.8 01/27/2022   VD25OH 77.3 06/16/2021   Plan: - Continue with Ergocalciferol 50K IU weekly. He denies a need for refill.   - weight loss will likely improve availability of vitamin D, thus encouraged Brett Wilcox to continue with meal plan and their weight loss efforts to further improve this condition.  Thus, we will need to monitor levels regularly every 3-4 mo on average to keep levels within normal limits and prevent over supplementation.   Seasonal allergies Assessment: Condition is now well controlled. He continue to take Singulair ans Xyzal.   Plan: - Continue with Singulair 10mg  and Xyzal 5mg  daily to improve his seasonal allergies.   - Continue with anti-inflammatory foods.   - I will continue to monitor pt symptoms as necessary.    Pre-diabetes Assessment: Condition is Controlled. His A1c is within the optimal range and his  insulin level has improved from 13.6 to 6.3. He continues to take Zepbound once weekly and is tolerating this well. He denies N/V/D and any adverse side effects. He denies any hunger and cravings.  Lab Results  Component Value Date   HGBA1C 5.4 01/26/2023   HGBA1C 5.4 01/27/2022   HGBA1C 5.5 06/16/2021   INSULIN 6.3 01/26/2023   INSULIN 13.6 01/27/2022   INSULIN 12.2 06/16/2021    Plan: - Continue with Zepbound 5mg  once weekly. I will refill today.   - Anticipatory guidance given.    Brett Wilcox will continue to work on weight loss, exercise, via their meal plan we devised to help decrease the risk of progressing to diabetes.   - We will recheck A1c and fasting insulin level in approximately 3 months from last check, or as deemed appropriate.    TREATMENT PLAN FOR OBESITY: BMI 28.0-28.9,adult- current bmi 28.19 Class 1 obesity due to excess calories with serious comorbidity and body mass index (BMI) of 33.0 to 33.9 in adult Assessment:  Brett Wilcox is here to discuss his progress with his obesity treatment plan along with follow-up of his obesity related diagnoses. See Medical Weight Management Flowsheet for complete bioelectrical impedance results.  Condition is not optimized. Biometric data collected today, was reviewed with patient.   Since last office visit on 04/13/2023 patient's  Muscle mass has decreased by 2lb.  Fat mass has decreased by 3.2lb. Total body water has increased by 1.2lb.  Counseling done on how various foods will affect these numbers and how to maximize success  Total lbs lost to date: 35lbs  Total weight loss percentage to date: -15.49%  Plan: Continue to follow our prudent Category 2 Plan with B and L options and with 6 oz of lean protein at lunch and 10 oz at dinner    Behavioral Intervention Additional resources provided today: protein equivalence handout Evidence-based interventions for health behavior change were utilized today including the  discussion of self monitoring techniques, problem-solving barriers and SMART goal setting techniques.   Regarding patient's less desirable eating habits and patterns, we employed the technique of small changes.  Pt will specifically work on: Meeting his 10oz protein intake for next visit.    Recommended Physical Activity Goals  Brett Wilcox has been advised to slowly work up to 150 minutes of moderate intensity aerobic activity a week and strengthening exercises 2-3 times per week for cardiovascular health, weight loss maintenance and preservation of muscle mass.   He has agreed to Continue current level of physical activity    FOLLOW UP: No follow-ups on file. He was informed of the importance of frequent follow up visits to maximize his success with intensive lifestyle modifications for his multiple health conditions.  Subjective:   Chief complaint: Obesity Brett Wilcox is here to discuss his progress with his obesity treatment plan. He is on the the Category 2 Plan with B and L options and with  6 oz of lean protein at lunch and 8-10 oz at dinner and states he is following his eating plan approximately 75% of the time. He states he is walking 45 minutes 7 days per week and weight lifting 4 days a week.  Interval History:  Brett Wilcox is here for a follow up office visit.  Since last OV he has been well. He does note that the amount of protein is a lot but is still meeting his lunch protein goals at least but is working on meeting his dinner protein goals. He has recently joined a gym in [place].    Pharmacotherapy for weight loss: He is currently taking Zepbound for medical weight loss.  Denies side effects.    Review of Systems:  Pertinent positives were addressed with patient today.   Reviewed by clinician on day of visit: allergies, medications, problem list, medical history, surgical history, family history, social history, and previous encounter notes.  Weight Summary and  Biometrics   Weight Lost Since Last Visit: 5lb  Weight Gained Since Last Visit: 0  Vitals Temp: 98 F (36.7 C) BP: 119/83 Pulse Rate: 68 SpO2: 98 %   Anthropometric Measurements Height: 5\' 9"  (1.753 m) Weight: 191 lb (86.6 kg) BMI (Calculated): 28.19 Weight at Last Visit: 196lb Weight Lost Since Last Visit: 5lb Weight Gained Since Last Visit: 0 Starting Weight: 226lb Total Weight Loss (lbs): 35 lb (15.9 kg) Peak Weight: 226lb   Body Composition  Body Fat %: 25.3 % Fat Mass (lbs): 48.4 lbs Muscle Mass (lbs): 135.6 lbs Total Body Water (lbs): 95.8 lbs Visceral Fat Rating : 13   Other Clinical Data Fasting: no Labs: no Today's Visit #: 27 Starting Date: 02/25/21     Objective:   PHYSICAL EXAM: Blood pressure 119/83, pulse 68, temperature 98 F (36.7 C), height 5\' 9"  (1.753 m), weight 191 lb (86.6 kg), SpO2 98%. Body mass index is 28.21 kg/m.  General: Well Developed,  well nourished, and in no acute distress.  HEENT: Normocephalic, atraumatic Skin: Warm and dry, cap RF less 2 sec, good turgor Chest:  Normal excursion, shape, no gross abn Respiratory: speaking in full sentences, no conversational dyspnea NeuroM-Sk: Ambulates w/o assistance, moves * 4 Psych: A and O *3, insight good, mood-full  DIAGNOSTIC DATA REVIEWED:  BMET    Component Value Date/Time   NA 141 01/26/2023 0919   K 4.3 01/26/2023 0919   CL 100 01/26/2023 0919   CO2 23 01/26/2023 0919   GLUCOSE 102 (H) 01/26/2023 0919   GLUCOSE 85 11/17/2017 1331   BUN 14 01/26/2023 0919   CREATININE 0.87 01/26/2023 0919   CALCIUM 9.7 01/26/2023 0919   Lab Results  Component Value Date   HGBA1C 5.4 01/26/2023   HGBA1C 6.1 (H) 02/25/2021   Lab Results  Component Value Date   INSULIN 6.3 01/26/2023   INSULIN 23.8 02/25/2021   Lab Results  Component Value Date   TSH 2.500 01/26/2023   CBC    Component Value Date/Time   WBC 6.0 06/16/2021 0855   RBC 6.56 (H) 06/16/2021 0855   HGB 19.4  (H) 06/16/2021 0855   HCT 58.7 (H) 06/16/2021 0855   PLT 222 06/16/2021 0855   MCV 90 06/16/2021 0855   MCH 29.6 06/16/2021 0855   MCHC 33.0 06/16/2021 0855   RDW 14.7 06/16/2021 0855   Iron Studies No results found for: "IRON", "TIBC", "FERRITIN", "IRONPCTSAT" Lipid Panel     Component Value Date/Time   CHOL 155 01/26/2023 0919   TRIG 119 01/26/2023 0919   HDL 52 01/26/2023 0919   CHOLHDL 3.0 01/26/2023 0919   LDLCALC 82 01/26/2023 0919   Hepatic Function Panel     Component Value Date/Time   PROT 7.2 01/26/2023 0919   ALBUMIN 5.1 (H) 01/26/2023 0919   AST 31 01/26/2023 0919   ALT 51 (H) 01/26/2023 0919   ALKPHOS 64 01/26/2023 0919   BILITOT 0.6 01/26/2023 0919      Component Value Date/Time   TSH 2.500 01/26/2023 0919   Nutritional Lab Results  Component Value Date   VD25OH 61.0 01/26/2023   VD25OH 63.8 01/27/2022   VD25OH 77.3 06/16/2021    Attestations:   I, Clinical biochemist, acting as a Stage manager for [employer], DO., have compiled all relevant documentation for today's office visit on behalf of Thomasene Lot, DO, while in the presence of [employer], DO.  I have reviewed the above documentation for accuracy and completeness, and I agree with the above. Brett Wilcox, D.O.  The 21st Century Cures Act was signed into law in 2016 which includes the topic of electronic health records.  This provides immediate access to information in MyChart.  This includes consultation notes, operative notes, office notes, lab results and pathology reports.  If you have any questions about what you read please let us know at your next visit so we can discuss your concerns and take corrective action if need be.  We are right here with you.

## 2023-05-11 ENCOUNTER — Other Ambulatory Visit (HOSPITAL_COMMUNITY): Payer: Self-pay

## 2023-05-11 ENCOUNTER — Other Ambulatory Visit: Payer: Self-pay

## 2023-05-11 MED ORDER — ZEPBOUND 5 MG/0.5ML ~~LOC~~ SOAJ
5.0000 mg | SUBCUTANEOUS | 1 refills | Status: DC
Start: 2023-05-11 — End: 2023-06-19
  Filled 2023-05-11: qty 2, 28d supply, fill #0
  Filled 2023-06-01 – 2023-06-02 (×2): qty 2, 28d supply, fill #1

## 2023-05-15 ENCOUNTER — Ambulatory Visit (INDEPENDENT_AMBULATORY_CARE_PROVIDER_SITE_OTHER): Payer: BC Managed Care – PPO | Admitting: Family Medicine

## 2023-05-31 DIAGNOSIS — L814 Other melanin hyperpigmentation: Secondary | ICD-10-CM | POA: Diagnosis not present

## 2023-05-31 DIAGNOSIS — L570 Actinic keratosis: Secondary | ICD-10-CM | POA: Diagnosis not present

## 2023-05-31 DIAGNOSIS — L720 Epidermal cyst: Secondary | ICD-10-CM | POA: Diagnosis not present

## 2023-05-31 DIAGNOSIS — D225 Melanocytic nevi of trunk: Secondary | ICD-10-CM | POA: Diagnosis not present

## 2023-05-31 DIAGNOSIS — L821 Other seborrheic keratosis: Secondary | ICD-10-CM | POA: Diagnosis not present

## 2023-06-01 ENCOUNTER — Encounter (HOSPITAL_COMMUNITY): Payer: Self-pay

## 2023-06-01 ENCOUNTER — Other Ambulatory Visit (HOSPITAL_COMMUNITY): Payer: Self-pay

## 2023-06-01 ENCOUNTER — Other Ambulatory Visit (HOSPITAL_BASED_OUTPATIENT_CLINIC_OR_DEPARTMENT_OTHER): Payer: Self-pay

## 2023-06-02 ENCOUNTER — Other Ambulatory Visit (HOSPITAL_COMMUNITY): Payer: Self-pay

## 2023-06-08 DIAGNOSIS — G4733 Obstructive sleep apnea (adult) (pediatric): Secondary | ICD-10-CM | POA: Diagnosis not present

## 2023-06-08 DIAGNOSIS — E782 Mixed hyperlipidemia: Secondary | ICD-10-CM | POA: Diagnosis not present

## 2023-06-08 DIAGNOSIS — I10 Essential (primary) hypertension: Secondary | ICD-10-CM | POA: Diagnosis not present

## 2023-06-13 DIAGNOSIS — M9904 Segmental and somatic dysfunction of sacral region: Secondary | ICD-10-CM | POA: Diagnosis not present

## 2023-06-13 DIAGNOSIS — M545 Low back pain, unspecified: Secondary | ICD-10-CM | POA: Diagnosis not present

## 2023-06-13 DIAGNOSIS — M9905 Segmental and somatic dysfunction of pelvic region: Secondary | ICD-10-CM | POA: Diagnosis not present

## 2023-06-13 DIAGNOSIS — M9903 Segmental and somatic dysfunction of lumbar region: Secondary | ICD-10-CM | POA: Diagnosis not present

## 2023-06-19 ENCOUNTER — Ambulatory Visit (INDEPENDENT_AMBULATORY_CARE_PROVIDER_SITE_OTHER): Payer: BC Managed Care – PPO | Admitting: Family Medicine

## 2023-06-19 ENCOUNTER — Other Ambulatory Visit (INDEPENDENT_AMBULATORY_CARE_PROVIDER_SITE_OTHER): Payer: Self-pay

## 2023-06-19 ENCOUNTER — Encounter (INDEPENDENT_AMBULATORY_CARE_PROVIDER_SITE_OTHER): Payer: Self-pay | Admitting: Family Medicine

## 2023-06-19 VITALS — BP 120/70 | HR 73 | Temp 98.1°F | Ht 69.0 in | Wt 188.0 lb

## 2023-06-19 DIAGNOSIS — E6609 Other obesity due to excess calories: Secondary | ICD-10-CM

## 2023-06-19 DIAGNOSIS — E559 Vitamin D deficiency, unspecified: Secondary | ICD-10-CM

## 2023-06-19 DIAGNOSIS — R7303 Prediabetes: Secondary | ICD-10-CM | POA: Diagnosis not present

## 2023-06-19 DIAGNOSIS — Z6827 Body mass index (BMI) 27.0-27.9, adult: Secondary | ICD-10-CM

## 2023-06-19 DIAGNOSIS — J302 Other seasonal allergic rhinitis: Secondary | ICD-10-CM | POA: Diagnosis not present

## 2023-06-19 DIAGNOSIS — Z6828 Body mass index (BMI) 28.0-28.9, adult: Secondary | ICD-10-CM

## 2023-06-19 MED ORDER — MONTELUKAST SODIUM 10 MG PO TABS
10.0000 mg | ORAL_TABLET | Freq: Every day | ORAL | 0 refills | Status: DC
Start: 2023-06-19 — End: 2023-07-31

## 2023-06-19 MED ORDER — LEVOCETIRIZINE DIHYDROCHLORIDE 5 MG PO TABS
5.0000 mg | ORAL_TABLET | Freq: Every evening | ORAL | 0 refills | Status: DC
Start: 2023-06-19 — End: 2023-07-31

## 2023-06-19 MED ORDER — VITAMIN D (ERGOCALCIFEROL) 1.25 MG (50000 UNIT) PO CAPS
50000.0000 [IU] | ORAL_CAPSULE | ORAL | 0 refills | Status: DC
Start: 2023-06-19 — End: 2023-07-31

## 2023-06-19 MED ORDER — ZEPBOUND 5 MG/0.5ML ~~LOC~~ SOAJ: 5.0000 mg | SUBCUTANEOUS | 1 refills | Status: DC

## 2023-06-19 NOTE — Progress Notes (Signed)
Brett Wilcox, D.O.  ABFM, ABOM Specializing in Clinical Bariatric Medicine  Office located at: [address] [place], [place]  [address]     Assessment and Plan:  Check labs next OV or following Medications Discontinued During This Encounter  Medication Reason   Vitamin D, Ergocalciferol, (DRISDOL) 1.25 MG (50000 UNIT) CAPS capsule Reorder   montelukast (SINGULAIR) 10 MG tablet Reorder   levocetirizine (XYZAL) 5 MG tablet Reorder    Meds ordered this encounter  Medications   Vitamin D, Ergocalciferol, (DRISDOL) 1.25 MG (50000 UNIT) CAPS capsule    Sig: Take 1 capsule (50,000 Units total) by mouth every 7 (seven) days.    Dispense:  12 capsule    Refill:  0   montelukast (SINGULAIR) 10 MG tablet    Sig: Take 1 tablet (10 mg total) by mouth at bedtime.    Dispense:  90 tablet    Refill:  0   levocetirizine (XYZAL) 5 MG tablet    Sig: Take 1 tablet (5 mg total) by mouth every evening.    Dispense:  90 tablet    Refill:  0    Pre-diabetes Assessment: Condition is Not at goal.. He continues Zepound without difficulty. He denies any N/V/D. He endorses that his hunger and cravings are well controlled.  Lab Results  Component Value Date   HGBA1C 5.4 01/26/2023   HGBA1C 5.4 01/27/2022   HGBA1C 5.5 06/16/2021   INSULIN 6.3 01/26/2023   INSULIN 13.6 01/27/2022   INSULIN 12.2 06/16/2021    Plan: - Continue with Zepbound at current dose. Once weekly. He denies need for an increase in dosage and refill.   - I advised him to search [facility] or Zepbound online.  Patient will let us know in the near future if he wants Terzepitide to go direct to [facility] or another pharmacy.   - Anticipatory guidance given.    - We will recheck A1c and fasting insulin level in approximately 3 months from last check, or as deemed appropriate.    Vitamin D deficiency Assessment: Condition is Controlled.This is well controlled with Ergocalciferol 50K IU weekly. He tolerates this well and  denies any adverse side effects.   Lab Results  Component Value Date   VD25OH 61.0 01/26/2023   VD25OH 63.8 01/27/2022   VD25OH 77.3 06/16/2021   Plan: - Continue ERGO at current dose and he was encouraged to continue to take the medicine until told otherwise.   I will refill today.  - weight loss will likely improve availability of vitamin D, thus encouraged Brett Wilcox to continue with meal plan and their weight loss efforts to further improve this condition.  Thus, we will need to monitor levels regularly (every 3-4 mo on average) to keep levels within normal limits and prevent over supplementation.   Seasonal allergies Assessment: Condition is Controlled. He endorses taking Xyzal and Singulair consistently and that this controls his allergies well. He inquired when he should get his COVID and Flu shot.   Plan: - Continue Xyzal and Singulair at current dose as directed. I will refill these today.   TREATMENT PLAN FOR OBESITY: BMI 28.0-28.9,adult- current bmi 27.75 Class 1 obesity due to excess calories with serious comorbidity and body mass index (BMI) of 33.0 to 33.9 in adult Assessment:  Brett Wilcox is here to discuss his progress with his obesity treatment plan along with follow-up of his obesity related diagnoses. See Medical Weight Management Flowsheet for complete bioelectrical impedance results.  Condition is docourse:  improving. Biometric data collected today, was reviewed with patient.   Since last office visit on 05/10/2023 patient's  Muscle mass has increased by 0.2lb. Fat mass has decreased by 2.4lb. Total body water has decreased by 2lb.  Counseling done on how various foods will affect these numbers and how to maximize success  Total lbs lost to date: 38 Total weight loss percentage to date: 16.81%   Plan: - Vencil will work on healthier eating habits and follow the Category 2 Plan with breakfast and lunch options with 6oz at lunch and 8-10oz at dinner best they can.    Behavioral Intervention Additional resources provided today: patient declined Evidence-based interventions for health behavior change were utilized today including the discussion of self monitoring techniques, problem-solving barriers and SMART goal setting techniques.   Regarding patient's less desirable eating habits and patterns, we employed the technique of small changes.  Pt will specifically work on: Continue weightlifitng 3 days a week and walking 5 days a week for next visit.    He has agreed to Continue current level of physical activity   FOLLOW UP: Return in about 6 weeks (around 07/31/2023).  He was informed of the importance of frequent follow up visits to maximize his success with intensive lifestyle modifications for his multiple health conditions.  Subjective:   Chief complaint: Obesity Brett Wilcox is here to discuss his progress with his obesity treatment plan. He is on the Category 2 Plan with breakfast and lunch options with 6oz at lunch and 8-10oz at dinner and states he is following his eating plan approximately 70% of the time. He states he is doing tennis, weights, and walking 45 minutes 5 days per week.  Interval History:  Brett Wilcox is here for a follow up office visit.     Since last office visit:  He has been well. He endorses following the prescribed meal plan closely. He notes not struggling with certain parts of the nutritional plan. He inquired about some healthy carb replacements such as edamame and I answered these questions.   We reviewed his meal plan and all questions were answered.    Pharmacotherapy for weight loss: He is currently taking Zepbound for medical weight loss.  Denies side effects.    Review of Systems:  Pertinent positives were addressed with patient today.  Reviewed by clinician on day of visit: allergies, medications, problem list, medical history, surgical history, family history, social history, and previous encounter  notes.  Weight Summary and Biometrics   Weight Lost Since Last Visit: 3 lb  Weight Gained Since Last Visit: 0   Vitals Temp: 98.1 F (36.7 C) BP: 120/70 Pulse Rate: 73 SpO2: 95 %   Anthropometric Measurements Height: 5\' 9"  (1.753 m) Weight: 188 lb (85.3 kg) BMI (Calculated): 27.75 Weight at Last Visit: 191 lb Weight Lost Since Last Visit: 3 lb Weight Gained Since Last Visit: 0 Starting Weight: 226 lb Total Weight Loss (lbs): 38 lb (17.2 kg) Peak Weight: 226 lb   Body Composition  Body Fat %: 24.4 % Fat Mass (lbs): 46 lbs Muscle Mass (lbs): 135.8 lbs Total Body Water (lbs): 93.8 lbs Visceral Fat Rating : 12   Other Clinical Data Fasting: No Labs: No Today's Visit #: 28 Starting Date: 02/25/21     Objective:   PHYSICAL EXAM: Blood pressure 120/70, pulse 73, temperature 98.1 F (36.7 C), height 5\' 9"  (1.753 m), weight 188 lb (85.3 kg), SpO2 95%. Body mass index is 27.76 kg/m.  General: Well Developed, well  nourished, and in no acute distress.  HEENT: Normocephalic, atraumatic Skin: Warm and dry, cap RF less 2 sec, good turgor Chest:  Normal excursion, shape, no gross abn Respiratory: speaking in full sentences, no conversational dyspnea NeuroM-Sk: Ambulates w/o assistance, moves * 4 Psych: A and O *3, insight good, mood-full  DIAGNOSTIC DATA REVIEWED:  BMET    Component Value Date/Time   NA 141 01/26/2023 0919   K 4.3 01/26/2023 0919   CL 100 01/26/2023 0919   CO2 23 01/26/2023 0919   GLUCOSE 102 (H) 01/26/2023 0919   GLUCOSE 85 11/17/2017 1331   BUN 14 01/26/2023 0919   CREATININE 0.87 01/26/2023 0919   CALCIUM 9.7 01/26/2023 0919   Lab Results  Component Value Date   HGBA1C 5.4 01/26/2023   HGBA1C 6.1 (H) 02/25/2021   Lab Results  Component Value Date   INSULIN 6.3 01/26/2023   INSULIN 23.8 02/25/2021   Lab Results  Component Value Date   TSH 2.500 01/26/2023   CBC    Component Value Date/Time   WBC 6.0 06/16/2021 0855   RBC  6.56 (H) 06/16/2021 0855   HGB 19.4 (H) 06/16/2021 0855   HCT 58.7 (H) 06/16/2021 0855   PLT 222 06/16/2021 0855   MCV 90 06/16/2021 0855   MCH 29.6 06/16/2021 0855   MCHC 33.0 06/16/2021 0855   RDW 14.7 06/16/2021 0855   Iron Studies No results found for: "IRON", "TIBC", "FERRITIN", "IRONPCTSAT" Lipid Panel     Component Value Date/Time   CHOL 155 01/26/2023 0919   TRIG 119 01/26/2023 0919   HDL 52 01/26/2023 0919   CHOLHDL 3.0 01/26/2023 0919   LDLCALC 82 01/26/2023 0919   Hepatic Function Panel     Component Value Date/Time   PROT 7.2 01/26/2023 0919   ALBUMIN 5.1 (H) 01/26/2023 0919   AST 31 01/26/2023 0919   ALT 51 (H) 01/26/2023 0919   ALKPHOS 64 01/26/2023 0919   BILITOT 0.6 01/26/2023 0919      Component Value Date/Time   TSH 2.500 01/26/2023 0919   Nutritional Lab Results  Component Value Date   VD25OH 61.0 01/26/2023   VD25OH 63.8 01/27/2022   VD25OH 77.3 06/16/2021    Attestations:   I, Clinical biochemist, acting as a Stage manager for [employer], DO., have compiled all relevant documentation for today's office visit on behalf of Thomasene Lot, DO, while in the presence of [employer], DO.  I have reviewed the above documentation for accuracy and completeness, and I agree with the above. Brett Wilcox, D.O.  The 21st Century Cures Act was signed into law in 2016 which includes the topic of electronic health records.  This provides immediate access to information in MyChart.  This includes consultation notes, operative notes, office notes, lab results and pathology reports.  If you have any questions about what you read please let us know at your next visit so we can discuss your concerns and take corrective action if need be.  We are right here with you.

## 2023-06-20 ENCOUNTER — Other Ambulatory Visit (HOSPITAL_COMMUNITY): Payer: Self-pay

## 2023-06-20 DIAGNOSIS — M9903 Segmental and somatic dysfunction of lumbar region: Secondary | ICD-10-CM | POA: Diagnosis not present

## 2023-06-20 DIAGNOSIS — M9905 Segmental and somatic dysfunction of pelvic region: Secondary | ICD-10-CM | POA: Diagnosis not present

## 2023-06-20 DIAGNOSIS — M545 Low back pain, unspecified: Secondary | ICD-10-CM | POA: Diagnosis not present

## 2023-06-20 DIAGNOSIS — M9904 Segmental and somatic dysfunction of sacral region: Secondary | ICD-10-CM | POA: Diagnosis not present

## 2023-06-20 MED ORDER — ZEPBOUND 5 MG/0.5ML ~~LOC~~ SOAJ
5.0000 mg | SUBCUTANEOUS | 0 refills | Status: DC
  Filled 2023-06-20 – 2023-06-26 (×3): qty 2, 28d supply, fill #0

## 2023-06-20 MED ORDER — ZEPBOUND 5 MG/0.5ML ~~LOC~~ SOAJ
5.0000 mg | SUBCUTANEOUS | 1 refills | Status: DC
  Filled 2023-06-20: qty 2, 28d supply, fill #0

## 2023-06-21 ENCOUNTER — Other Ambulatory Visit (HOSPITAL_COMMUNITY): Payer: Self-pay

## 2023-06-22 ENCOUNTER — Other Ambulatory Visit: Payer: Self-pay

## 2023-06-26 ENCOUNTER — Other Ambulatory Visit: Payer: Self-pay

## 2023-06-27 DIAGNOSIS — M9904 Segmental and somatic dysfunction of sacral region: Secondary | ICD-10-CM | POA: Diagnosis not present

## 2023-06-27 DIAGNOSIS — M9905 Segmental and somatic dysfunction of pelvic region: Secondary | ICD-10-CM | POA: Diagnosis not present

## 2023-06-27 DIAGNOSIS — M545 Low back pain, unspecified: Secondary | ICD-10-CM | POA: Diagnosis not present

## 2023-06-27 DIAGNOSIS — M9903 Segmental and somatic dysfunction of lumbar region: Secondary | ICD-10-CM | POA: Diagnosis not present

## 2023-07-11 DIAGNOSIS — M9904 Segmental and somatic dysfunction of sacral region: Secondary | ICD-10-CM | POA: Diagnosis not present

## 2023-07-11 DIAGNOSIS — M9905 Segmental and somatic dysfunction of pelvic region: Secondary | ICD-10-CM | POA: Diagnosis not present

## 2023-07-11 DIAGNOSIS — M545 Low back pain, unspecified: Secondary | ICD-10-CM | POA: Diagnosis not present

## 2023-07-11 DIAGNOSIS — M9903 Segmental and somatic dysfunction of lumbar region: Secondary | ICD-10-CM | POA: Diagnosis not present

## 2023-07-17 ENCOUNTER — Other Ambulatory Visit (HOSPITAL_BASED_OUTPATIENT_CLINIC_OR_DEPARTMENT_OTHER): Payer: Self-pay

## 2023-07-17 MED ORDER — INFLUENZA VIRUS VACC SPLIT PF (FLUZONE) 0.5 ML IM SUSY
0.5000 mL | PREFILLED_SYRINGE | Freq: Once | INTRAMUSCULAR | 0 refills | Status: AC
  Filled 2023-07-17: qty 0.5, 1d supply, fill #0

## 2023-07-17 MED ORDER — COVID-19 MRNA VAC-TRIS(PFIZER) 30 MCG/0.3ML IM SUSY
0.3000 mL | PREFILLED_SYRINGE | Freq: Once | INTRAMUSCULAR | 0 refills | Status: AC
  Filled 2023-07-17: qty 0.3, 1d supply, fill #0

## 2023-07-31 ENCOUNTER — Other Ambulatory Visit (HOSPITAL_COMMUNITY): Payer: Self-pay

## 2023-07-31 ENCOUNTER — Encounter (INDEPENDENT_AMBULATORY_CARE_PROVIDER_SITE_OTHER): Payer: Self-pay | Admitting: Family Medicine

## 2023-07-31 ENCOUNTER — Ambulatory Visit (INDEPENDENT_AMBULATORY_CARE_PROVIDER_SITE_OTHER): Payer: BC Managed Care – PPO | Admitting: Family Medicine

## 2023-07-31 ENCOUNTER — Other Ambulatory Visit (INDEPENDENT_AMBULATORY_CARE_PROVIDER_SITE_OTHER): Payer: Self-pay | Admitting: Family Medicine

## 2023-07-31 VITALS — BP 126/80 | HR 72 | Temp 97.9°F | Ht 69.0 in | Wt 186.0 lb

## 2023-07-31 DIAGNOSIS — M2011 Hallux valgus (acquired), right foot: Secondary | ICD-10-CM | POA: Diagnosis not present

## 2023-07-31 DIAGNOSIS — Z6827 Body mass index (BMI) 27.0-27.9, adult: Secondary | ICD-10-CM

## 2023-07-31 DIAGNOSIS — E559 Vitamin D deficiency, unspecified: Secondary | ICD-10-CM

## 2023-07-31 DIAGNOSIS — M19071 Primary osteoarthritis, right ankle and foot: Secondary | ICD-10-CM | POA: Diagnosis not present

## 2023-07-31 DIAGNOSIS — M7989 Other specified soft tissue disorders: Secondary | ICD-10-CM | POA: Diagnosis not present

## 2023-07-31 DIAGNOSIS — E6609 Other obesity due to excess calories: Secondary | ICD-10-CM

## 2023-07-31 DIAGNOSIS — Z6828 Body mass index (BMI) 28.0-28.9, adult: Secondary | ICD-10-CM

## 2023-07-31 DIAGNOSIS — R7303 Prediabetes: Secondary | ICD-10-CM | POA: Diagnosis not present

## 2023-07-31 DIAGNOSIS — J302 Other seasonal allergic rhinitis: Secondary | ICD-10-CM

## 2023-07-31 DIAGNOSIS — E66811 Obesity, class 1: Secondary | ICD-10-CM

## 2023-07-31 MED ORDER — LEVOCETIRIZINE DIHYDROCHLORIDE 5 MG PO TABS: 5.0000 mg | ORAL_TABLET | Freq: Every evening | ORAL | 0 refills | Status: AC

## 2023-07-31 MED ORDER — ZEPBOUND 5 MG/0.5ML ~~LOC~~ SOAJ: 5.0000 mg | SUBCUTANEOUS | 1 refills | Status: DC

## 2023-07-31 MED ORDER — MONTELUKAST SODIUM 10 MG PO TABS
10.0000 mg | ORAL_TABLET | Freq: Every day | ORAL | 0 refills | Status: AC
Start: 2023-07-31 — End: ?

## 2023-07-31 MED ORDER — VITAMIN D (ERGOCALCIFEROL) 1.25 MG (50000 UNIT) PO CAPS: 50000.0000 [IU] | ORAL_CAPSULE | ORAL | 0 refills | Status: DC

## 2023-07-31 NOTE — Progress Notes (Signed)
Brett Wilcox, D.O.  ABFM, ABOM Specializing in Clinical Bariatric Medicine  Office located at: [address] [place], [place]  [address]     Assessment and Plan:  Come fasting for labs on 11/25 Medications Discontinued During This Encounter  Medication Reason   levothyroxine (SYNTHROID) 75 MCG tablet    Vitamin D, Ergocalciferol, (DRISDOL) 1.25 MG (50000 UNIT) CAPS capsule Reorder   montelukast (SINGULAIR) 10 MG tablet Reorder   levocetirizine (XYZAL) 5 MG tablet Reorder   tirzepatide (ZEPBOUND) 5 MG/0.5ML Pen Reorder    Meds ordered this encounter  Medications   Vitamin D, Ergocalciferol, (DRISDOL) 1.25 MG (50000 UNIT) CAPS capsule    Sig: Take 1 capsule (50,000 Units total) by mouth every 7 (seven) days.    Dispense:  12 capsule    Refill:  0   tirzepatide (ZEPBOUND) 5 MG/0.5ML Pen    Sig: Inject 5 mg into the skin once a week.    Dispense:  2 mL    Refill:  1   levocetirizine (XYZAL) 5 MG tablet    Sig: Take 1 tablet (5 mg total) by mouth every evening.    Dispense:  90 tablet    Refill:  0   montelukast (SINGULAIR) 10 MG tablet    Sig: Take 1 tablet (10 mg total) by mouth at bedtime.    Dispense:  90 tablet    Refill:  0    Seasonal allergies Assessment: Condition is Controlled.. Pt endorses his allergies are mainly controlled this season.   Plan: - Continue with Singulair and Xyzal as directed. I will refill today.   - I recommended that when outside for a prolonged period of time to wash his face or shower to improve his allergies.    Pre-diabetes Assessment: Condition is Not at goal.. This is being treated with Zepbound and he tolerates this well. He denies any GI upset, nausea, vomiting, and diarrhea. He endorses well controlled hunger and cravings.  Lab Results  Component Value Date   HGBA1C 5.4 01/26/2023   HGBA1C 5.4 01/27/2022   HGBA1C 5.5 06/16/2021   INSULIN 6.3 01/26/2023   INSULIN 13.6 01/27/2022   INSULIN 12.2 06/16/2021    Plan: -  Continue with Zepbound 5mg  once every 7 days. I will refill today.   - Focus on eating anything that swims or flies and anything grown off a tree.   - Anticipatory guidance given.    Brett Wilcox will continue to work on weight loss, exercise, via their meal plan we devised to help decrease the risk of progressing to diabetes.    Vitamin D deficiency Assessment: Condition is Controlled. Patient reports good compliance and tolerance of taking oral ERGO. He denies any adverse side effects on this medication.  Lab Results  Component Value Date   VD25OH 61.0 01/26/2023   VD25OH 63.8 01/27/2022   VD25OH 77.3 06/16/2021   Plan: - Continue with Ergocalciferol 50K IU weekly. I will refill today.   - With Winter approaching his vitamin D levels as prone to drop so we will need to monitor levels regularly to keep levels within normal limits and prevent over supplementation.   TREATMENT PLAN FOR OBESITY: BMI 28.0-28.9,adult- current bmi 27.45 Class 1 obesity due to excess calories with serious comorbidity and body mass index (BMI) of 33.0 to 33.9 in adult Assessment:  Brett Wilcox is here to discuss his progress with his obesity treatment plan along with follow-up of his obesity related diagnoses. See Medical Weight Management  Flowsheet for complete bioelectrical impedance results.  Condition is improving but not optimized. Biometric data collected today, was reviewed with patient.   Since last office visit on 06/19/2023 patient's  Muscle mass has decreased by 0.2lb. Fat mass has decreased by 2.8lb. Total body water has decreased by 1.4lb.  Counseling done on how various foods will affect these numbers and how to maximize success  Total lbs lost to date: 40 Total weight loss percentage to date: 17.70%   Plan: - Continue to adhere to the Category 2 Plan with breakfast and lunch options with 6oz at lunch and 8-10oz at dinner.  Behavioral Intervention Additional resources provided today:  patient declined Evidence-based interventions for health behavior change were utilized today including the discussion of self monitoring techniques, problem-solving barriers and SMART goal setting techniques.   Regarding patient's less desirable eating habits and patterns, we employed the technique of small changes.  Pt will specifically work on: Continue to follow meal plan for next visit.     He has agreed to Continue current level of physical activity    FOLLOW UP: Return in about 4 weeks (around 08/28/2023).  He was informed of the importance of frequent follow up visits to maximize his success with intensive lifestyle modifications for his multiple health conditions.  Subjective:   Chief complaint: Obesity Brett Wilcox is here to discuss his progress with his obesity treatment plan. He is on the the Category 2 Plan with breakfast and lunch options with 6oz at lunch and 8-10oz at dinner and states he is following his eating plan approximately 75% of the time. He states he is walking 40 minutes 6 days per week and weight lifting 40 minutes 3 days per week.   Interval History:  Brett Wilcox is here for a follow up office visit.     Since last office visit:  Since last OV he has been well and mainly sticking to his meal plan. He has been taking care of his wife during surgery and tries to keep her on the prescribed meal plan as long as she can eat. He inquired about what the optimal protein intake from protein drinks. He has also been making homemade jerky. He informed me that he hasn't been to the gym as much due to attending multiple events. Pt endorses being satisfied with his weight loss and wants to work more on getting lean now.   We reviewed his meal plan and all questions were answered.   Pharmacotherapy for weight loss: He is currently taking Zepbound for medical weight loss.  Denies side effects.    Review of Systems:  Pertinent positives were addressed with patient  today.  Reviewed by clinician on day of visit: allergies, medications, problem list, medical history, surgical history, family history, social history, and previous encounter notes.  Weight Summary and Biometrics   Weight Lost Since Last Visit: 2lb  Weight Gained Since Last Visit: 0lb   Vitals Temp: 97.9 F (36.6 C) BP: 126/80 Pulse Rate: 72 SpO2: 98 %   Anthropometric Measurements Height: 5\' 9"  (1.753 m) Weight: 186 lb (84.4 kg) BMI (Calculated): 27.45 Weight at Last Visit: 188lb Weight Lost Since Last Visit: 2lb Weight Gained Since Last Visit: 0lb Starting Weight: 226lb Total Weight Loss (lbs): 40 lb (18.1 kg) Peak Weight: 226lb   Body Composition  Body Fat %: 23.3 % Fat Mass (lbs): 43.2 lbs Muscle Mass (lbs): 135.6 lbs Total Body Water (lbs): 92.4 lbs Visceral Fat Rating : 12   Other Clinical Data  Fasting: No Labs: No Today's Visit #: 29 Starting Date: 02/25/21     Objective:   PHYSICAL EXAM: Blood pressure 126/80, pulse 72, temperature 97.9 F (36.6 C), height 5\' 9"  (1.753 m), weight 186 lb (84.4 kg), SpO2 98%. Body mass index is 27.47 kg/m.  General: Well Developed, well nourished, and in no acute distress.  HEENT: Normocephalic, atraumatic Skin: Warm and dry, cap RF less 2 sec, good turgor Chest:  Normal excursion, shape, no gross abn Respiratory: speaking in full sentences, no conversational dyspnea NeuroM-Sk: Ambulates w/o assistance, moves * 4 Psych: A and O *3, insight good, mood-full  DIAGNOSTIC DATA REVIEWED:  BMET    Component Value Date/Time   NA 141 01/26/2023 0919   K 4.3 01/26/2023 0919   CL 100 01/26/2023 0919   CO2 23 01/26/2023 0919   GLUCOSE 102 (H) 01/26/2023 0919   GLUCOSE 85 11/17/2017 1331   BUN 14 01/26/2023 0919   CREATININE 0.87 01/26/2023 0919   CALCIUM 9.7 01/26/2023 0919   Lab Results  Component Value Date   HGBA1C 5.4 01/26/2023   HGBA1C 6.1 (H) 02/25/2021   Lab Results  Component Value Date   INSULIN  6.3 01/26/2023   INSULIN 23.8 02/25/2021   Lab Results  Component Value Date   TSH 2.500 01/26/2023   CBC    Component Value Date/Time   WBC 6.0 06/16/2021 0855   RBC 6.56 (H) 06/16/2021 0855   HGB 19.4 (H) 06/16/2021 0855   HCT 58.7 (H) 06/16/2021 0855   PLT 222 06/16/2021 0855   MCV 90 06/16/2021 0855   MCH 29.6 06/16/2021 0855   MCHC 33.0 06/16/2021 0855   RDW 14.7 06/16/2021 0855   Iron Studies No results found for: "IRON", "TIBC", "FERRITIN", "IRONPCTSAT" Lipid Panel     Component Value Date/Time   CHOL 155 01/26/2023 0919   TRIG 119 01/26/2023 0919   HDL 52 01/26/2023 0919   CHOLHDL 3.0 01/26/2023 0919   LDLCALC 82 01/26/2023 0919   Hepatic Function Panel     Component Value Date/Time   PROT 7.2 01/26/2023 0919   ALBUMIN 5.1 (H) 01/26/2023 0919   AST 31 01/26/2023 0919   ALT 51 (H) 01/26/2023 0919   ALKPHOS 64 01/26/2023 0919   BILITOT 0.6 01/26/2023 0919      Component Value Date/Time   TSH 2.500 01/26/2023 0919   Nutritional Lab Results  Component Value Date   VD25OH 61.0 01/26/2023   VD25OH 63.8 01/27/2022   VD25OH 77.3 06/16/2021    Attestations:   I, Clinical biochemist, acting as a Stage manager for [employer], DO., have compiled all relevant documentation for today's office visit on behalf of Thomasene Lot, DO, while in the presence of [employer], DO.  I have reviewed the above documentation for accuracy and completeness, and I agree with the above. Brett Wilcox, D.O.  The 21st Century Cures Act was signed into law in 2016 which includes the topic of electronic health records.  This provides immediate access to information in MyChart.  This includes consultation notes, operative notes, office notes, lab results and pathology reports.  If you have any questions about what you read please let us know at your next visit so we can discuss your concerns and take corrective action if need be.  We are right here with you.

## 2023-08-02 ENCOUNTER — Other Ambulatory Visit (HOSPITAL_COMMUNITY): Payer: Self-pay

## 2023-08-03 ENCOUNTER — Other Ambulatory Visit (HOSPITAL_COMMUNITY): Payer: Self-pay

## 2023-08-04 ENCOUNTER — Other Ambulatory Visit (HOSPITAL_COMMUNITY): Payer: Self-pay

## 2023-08-04 MED ORDER — ZEPBOUND 5 MG/0.5ML ~~LOC~~ SOAJ: 5.0000 mg | SUBCUTANEOUS | 1 refills | Status: DC

## 2023-08-08 DIAGNOSIS — M9905 Segmental and somatic dysfunction of pelvic region: Secondary | ICD-10-CM | POA: Diagnosis not present

## 2023-08-08 DIAGNOSIS — M545 Low back pain, unspecified: Secondary | ICD-10-CM | POA: Diagnosis not present

## 2023-08-08 DIAGNOSIS — M9904 Segmental and somatic dysfunction of sacral region: Secondary | ICD-10-CM | POA: Diagnosis not present

## 2023-08-08 DIAGNOSIS — M9903 Segmental and somatic dysfunction of lumbar region: Secondary | ICD-10-CM | POA: Diagnosis not present

## 2023-08-09 ENCOUNTER — Other Ambulatory Visit (HOSPITAL_COMMUNITY): Payer: Self-pay

## 2023-08-09 MED ORDER — TIRZEPATIDE-WEIGHT MANAGEMENT 5 MG/0.5ML ~~LOC~~ SOAJ
5.0000 mg | SUBCUTANEOUS | 1 refills | Status: DC
  Filled 2023-08-09: qty 2, 28d supply, fill #0

## 2023-08-10 ENCOUNTER — Other Ambulatory Visit (HOSPITAL_COMMUNITY): Payer: Self-pay

## 2023-08-28 ENCOUNTER — Encounter (INDEPENDENT_AMBULATORY_CARE_PROVIDER_SITE_OTHER): Payer: Self-pay | Admitting: Family Medicine

## 2023-08-28 ENCOUNTER — Ambulatory Visit (INDEPENDENT_AMBULATORY_CARE_PROVIDER_SITE_OTHER): Payer: BC Managed Care – PPO | Admitting: Family Medicine

## 2023-08-28 ENCOUNTER — Other Ambulatory Visit: Payer: Self-pay

## 2023-08-28 VITALS — BP 114/82 | HR 86 | Temp 98.2°F | Ht 69.0 in | Wt 184.0 lb

## 2023-08-28 DIAGNOSIS — E66811 Obesity, class 1: Secondary | ICD-10-CM

## 2023-08-28 DIAGNOSIS — J302 Other seasonal allergic rhinitis: Secondary | ICD-10-CM

## 2023-08-28 DIAGNOSIS — R7303 Prediabetes: Secondary | ICD-10-CM | POA: Diagnosis not present

## 2023-08-28 DIAGNOSIS — E7849 Other hyperlipidemia: Secondary | ICD-10-CM | POA: Diagnosis not present

## 2023-08-28 DIAGNOSIS — Z6827 Body mass index (BMI) 27.0-27.9, adult: Secondary | ICD-10-CM

## 2023-08-28 DIAGNOSIS — Z6828 Body mass index (BMI) 28.0-28.9, adult: Secondary | ICD-10-CM

## 2023-08-28 DIAGNOSIS — E6609 Other obesity due to excess calories: Secondary | ICD-10-CM

## 2023-08-28 DIAGNOSIS — E559 Vitamin D deficiency, unspecified: Secondary | ICD-10-CM | POA: Diagnosis not present

## 2023-08-28 DIAGNOSIS — E038 Other specified hypothyroidism: Secondary | ICD-10-CM

## 2023-08-28 MED ORDER — ZEPBOUND 5 MG/0.5ML ~~LOC~~ SOAJ
5.0000 mg | SUBCUTANEOUS | 1 refills | Status: DC
  Filled 2023-08-28: qty 2, 28d supply, fill #0
  Filled 2023-09-20: qty 2, 28d supply, fill #1

## 2023-08-28 NOTE — Progress Notes (Signed)
Brett Wilcox, D.O.  ABFM, ABOM Specializing in Clinical Bariatric Medicine  Office located at: [address] [place], [place]  [address]   Assessment and Plan:  Review labs with pt next OV.  FOR THE DISEASE OF OBESITY: BMI 28.0-28.9,adult- current bmi 27.16 Class 1 obesity due to excess calories with serious comorbidity and body mass index (BMI) of 33.0 to 33.9 in adult -     Zepbound; Inject 5 mg into the skin once a week.  Dispense: 2 mL; Refill: 1 Since last office visit on 07/31/2023 patient's  Muscle mass has decreased by 0.2lb. Fat mass has decreased by 0.8lb. Total body water has increased by 0.4lb.  Counseling done on how various foods will affect these numbers and how to maximize success  Total lbs lost to date: 18.58% Total weight loss percentage to date: 42    Recommended Dietary Goals Brett Wilcox is currently in the action stage of change. As such, his goal is to continue weight management plan.  He has agreed to: continue current plan   Behavioral Intervention We discussed the following today: increasing lean protein intake to established goals, continue to work on implementation of reduced calorie nutritional plan, continue to practice mindfulness when eating, and planning for success  Additional resources provided today: None  Evidence-based interventions for health behavior change were utilized today including the discussion of self monitoring techniques, problem-solving barriers and SMART goal setting techniques.   Regarding patient's less desirable eating habits and patterns, we employed the technique of small changes.   Pt will specifically work on: increase protein and continue to follow the meal plan for next visit.    Recommended Physical Activity Goals Brett Wilcox has been advised to work up to 150 minutes of moderate intensity aerobic activity a week and strengthening exercises 2-3 times per week for cardiovascular health, weight loss maintenance and  preservation of muscle mass.   He has agreed to :  Continue current level of physical activity  and continue to gradually increase the amount and intensity of exercise    Pharmacotherapy We discussed various medication options to help Brett Wilcox with his weight loss efforts and we both agreed to : continue with nutritional and behavioral strategies and continue current anti-obesity medication regimen   FOR ASSOCIATED CONDITIONS ADDRESSED TODAY: Pre-diabetes Assessment & Plan: Condition is Not optimized.. Pt endorses his hunger and cravings are under good control with Zepbound. He finds in the evenings he becomes more hungry. He tolerates this without difficulty and has never had any adverse side effects since starting even when eating off plan. Pt informed me that he was a couple days late on his last injection due to a work trip. Pt visceral fat has improved to 11.  Lab Results  Component Value Date   HGBA1C 5.4 01/26/2023   HGBA1C 5.4 01/27/2022   HGBA1C 5.5 06/16/2021   INSULIN 6.3 01/26/2023   INSULIN 13.6 01/27/2022   INSULIN 12.2 06/16/2021   Plan: - Continue with Zepbound at current dose once weekly. In addition, we discussed the risks and benefits of increasing medication dosage. Continue to decrease simple carbs/ sugars; increase fiber and proteins -> follow his meal plan. We will recheck A1c and fasting insulin level in approximately 3 months from last check, or as deemed appropriate.  Pre-diabetes -     Hemoglobin A1c -     Insulin, random -     TSH -     T4, free   Vitamin D deficiency Assessment &  Plan: Condition is Controlled.. Pt vitamin D level has been within good control. He continues to take prescribed Ergocalciferol once weekly. He tolerates this well and denies any adverse side effects.  Lab Results  Component Value Date   VD25OH 61.0 01/26/2023   VD25OH 63.8 01/27/2022   VD25OH 77.3 06/16/2021   Plan: - Continue Ergocalciferol at current dose as directed by  prescribing provider. We will check his labs today and review next OV to keep levels within normal limits. Vitamin D deficiency -     VITAMIN D 25 Hydroxy (Vit-D Deficiency, Fractures)   Seasonal allergies Assessment & Plan: Condition is Controlled.. Pt endorses his allergies are well controlled. He continues Xyzal, Singulair, and Flonase. He informed me that he does not wear a face mask when outside at work.   Plan: - Continue with Xyzal 5mg  and Singulair 10mg  . I advised that he take a shower and thoroughly wash his face after returning home from being outside at work and use a nasal saline rinse before using flonase.    Other hyperlipidemia Assessment:  Condition is Controlled. This is being controlled with Crestor once daily. He denies any adverse side effects on this medication and has made an effort to decrease intake of saturated/trans fats.  Lab Results  Component Value Date   CHOL 155 01/26/2023   HDL 52 01/26/2023   LDLCALC 82 01/26/2023   TRIG 119 01/26/2023   CHOLHDL 3.0 01/26/2023   Plan: - Continue Crestor at current dose once daily.  We extensively discussed several lifestyle modifications today and he will continue to work on diet, exercise and weight loss efforts.  We will continue routine screening as patient continues to achieve health goals along their weight loss journey Other hyperlipidemia -     Lipid panel -     Comprehensive metabolic panel   Other specified hypothyroidism Assessment:  Condition is Controlled.. Pt reports experiencing fatigue occasionally.  Lab Results  Component Value Date   TSH 2.500 01/26/2023   Plan:- Due to pt report of fatigue and hx of hypothyroidism we will recheck his levels today. Continue follow-up with PCP.    Follow up:   Return in about 24 days (around 09/21/2023). He was informed of the importance of frequent follow up visits to maximize his success with intensive lifestyle modifications for his multiple health  conditions.Brett Wilcox is aware that we will review all of his lab results at our next visit together in person.  He is aware that if anything is critical/ life threatening with the results, we will be contacting him via MyChart or by my CMA will be calling them prior to the office visit to discuss acute management.    Subjective:   Chief complaint: Obesity Atticus is here to discuss his progress with his obesity treatment plan. He is on the Category 2 Plan with breakfast and lunch options with 6oz at lunch and 8-10oz at dinner and states he is following his eating plan approximately 70% of the time. He states he is doing weights 45 minutes 3-4 days per week and walking 45 minutes 5 days per week..  Interval History:  SEVERN KROTZ is here for a follow up office visit. Since last OV,  he has been well. He has recently returned from a business trip. He endorses the continuance of being active. He has noticed himself looking slimmer and being down a couple of pants sizes.   Barriers identified: none.   Pharmacotherapy for weight loss: He  is currently taking Zepbound with adequate clinical response  and without side effects..   Review of Systems:  Pertinent positives were addressed with patient today.  Reviewed by clinician on day of visit: allergies, medications, problem list, medical history, surgical history, family history, social history, and previous encounter notes.  Weight Summary and Biometrics   Weight Lost Since Last Visit: 2lb  Weight Gained Since Last Visit: 0lb    Vitals Temp: 98.2 F (36.8 C) BP: 114/82 Pulse Rate: 86 SpO2: 97 %   Anthropometric Measurements Height: 5\' 9"  (1.753 m) Weight: 184 lb (83.5 kg) BMI (Calculated): 27.16 Weight at Last Visit: 186lb Weight Lost Since Last Visit: 2lb Weight Gained Since Last Visit: 0lb Starting Weight: 226lb Total Weight Loss (lbs): 42 lb (19.1 kg) Peak Weight: 226lb   Body Composition  Body Fat %: 22.9  % Fat Mass (lbs): 42.4 lbs Muscle Mass (lbs): 135.4 lbs Total Body Water (lbs): 92.8 lbs Visceral Fat Rating : 11   Other Clinical Data Fasting: Yes Labs: Yes Today's Visit #: 30 Starting Date: 02/25/21    Objective:   PHYSICAL EXAM: Blood pressure 114/82, pulse 86, temperature 98.2 F (36.8 C), height 5\' 9"  (1.753 m), weight 184 lb (83.5 kg), SpO2 97%. Body mass index is 27.17 kg/m.  General: he is overweight, cooperative and in no acute distress. PSYCH: Has normal mood, affect and thought process.   HEENT: EOMI, sclerae are anicteric. Lungs: Normal breathing effort, no conversational dyspnea. Extremities: Moves * 4 Neurologic: A and O * 3, good insight  DIAGNOSTIC DATA REVIEWED: BMET    Component Value Date/Time   NA 141 01/26/2023 0919   K 4.3 01/26/2023 0919   CL 100 01/26/2023 0919   CO2 23 01/26/2023 0919   GLUCOSE 102 (H) 01/26/2023 0919   GLUCOSE 85 11/17/2017 1331   BUN 14 01/26/2023 0919   CREATININE 0.87 01/26/2023 0919   CALCIUM 9.7 01/26/2023 0919   Lab Results  Component Value Date   HGBA1C 5.4 01/26/2023   HGBA1C 6.1 (H) 02/25/2021   Lab Results  Component Value Date   INSULIN 6.3 01/26/2023   INSULIN 23.8 02/25/2021   Lab Results  Component Value Date   TSH 2.500 01/26/2023   CBC    Component Value Date/Time   WBC 6.0 06/16/2021 0855   RBC 6.56 (H) 06/16/2021 0855   HGB 19.4 (H) 06/16/2021 0855   HCT 58.7 (H) 06/16/2021 0855   PLT 222 06/16/2021 0855   MCV 90 06/16/2021 0855   MCH 29.6 06/16/2021 0855   MCHC 33.0 06/16/2021 0855   RDW 14.7 06/16/2021 0855   Iron Studies No results found for: "IRON", "TIBC", "FERRITIN", "IRONPCTSAT" Lipid Panel     Component Value Date/Time   CHOL 155 01/26/2023 0919   TRIG 119 01/26/2023 0919   HDL 52 01/26/2023 0919   CHOLHDL 3.0 01/26/2023 0919   LDLCALC 82 01/26/2023 0919   Hepatic Function Panel     Component Value Date/Time   PROT 7.2 01/26/2023 0919   ALBUMIN 5.1 (H)  01/26/2023 0919   AST 31 01/26/2023 0919   ALT 51 (H) 01/26/2023 0919   ALKPHOS 64 01/26/2023 0919   BILITOT 0.6 01/26/2023 0919      Component Value Date/Time   TSH 2.500 01/26/2023 0919   Nutritional Lab Results  Component Value Date   VD25OH 61.0 01/26/2023   VD25OH 63.8 01/27/2022   VD25OH 77.3 06/16/2021    Attestations:   I, Clinical biochemist, acting as a Stage manager  for Thomasene Lot, DO., have compiled all relevant documentation for today's office visit on behalf of Thomasene Lot, DO, while in the presence of [employer], DO.  Reviewed by clinician on day of visit: allergies, medications, problem list, medical history, surgical history, family history, social history, and previous encounter notes pertinent to patient's obesity diagnosis. preparing to see patient (e.g. review and interpretation of tests, old notes ), obtaining and/or reviewing separately obtained history, performing a medically appropriate examination or evaluation, counseling and educating the patient, ordering medications, test or procedures, documenting clinical information in the electronic or other health care record, and independently interpreting results and communicating results to the patient, family, or caregiver   I have reviewed the above documentation for accuracy and completeness, and I agree with the above. Brett Wilcox, D.O.  The 21st Century Cures Act was signed into law in 2016 which includes the topic of electronic health records.  This provides immediate access to information in MyChart.  This includes consultation notes, operative notes, office notes, lab results and pathology reports.  If you have any questions about what you read please let us know at your next visit so we can discuss your concerns and take corrective action if need be.  We are right here with you.

## 2023-08-29 LAB — HEMOGLOBIN A1C
Est. average glucose Bld gHb Est-mCnc: 105 mg/dL
Hgb A1c MFr Bld: 5.3 % (ref 4.8–5.6)

## 2023-08-29 LAB — COMPREHENSIVE METABOLIC PANEL
ALT: 38 [IU]/L (ref 0–44)
AST: 30 [IU]/L (ref 0–40)
Albumin: 4.8 g/dL (ref 3.8–4.9)
Alkaline Phosphatase: 59 [IU]/L (ref 44–121)
BUN/Creatinine Ratio: 20 (ref 9–20)
BUN: 17 mg/dL (ref 6–24)
Bilirubin Total: 0.5 mg/dL (ref 0.0–1.2)
CO2: 25 mmol/L (ref 20–29)
Calcium: 9.6 mg/dL (ref 8.7–10.2)
Chloride: 100 mmol/L (ref 96–106)
Creatinine, Ser: 0.87 mg/dL (ref 0.76–1.27)
Globulin, Total: 2.2 g/dL (ref 1.5–4.5)
Glucose: 79 mg/dL (ref 70–99)
Potassium: 4.5 mmol/L (ref 3.5–5.2)
Sodium: 141 mmol/L (ref 134–144)
Total Protein: 7 g/dL (ref 6.0–8.5)
eGFR: 101 mL/min/{1.73_m2} (ref >59)

## 2023-08-29 LAB — LIPID PANEL
Chol/HDL Ratio: 2.3 {ratio} (ref 0.0–5.0)
Cholesterol, Total: 138 mg/dL (ref 100–199)
HDL: 61 mg/dL (ref >39)
LDL Chol Calc (NIH): 63 mg/dL (ref 0–99)
Triglycerides: 66 mg/dL (ref 0–149)
VLDL Cholesterol Cal: 14 mg/dL (ref 5–40)

## 2023-08-29 LAB — INSULIN, RANDOM: INSULIN: 7.3 u[IU]/mL (ref 2.6–24.9)

## 2023-08-29 LAB — VITAMIN D 25 HYDROXY (VIT D DEFICIENCY, FRACTURES): Vit D, 25-Hydroxy: 98.1 ng/mL (ref 30.0–100.0)

## 2023-08-29 LAB — TSH: TSH: 2.62 u[IU]/mL (ref 0.450–4.500)

## 2023-08-29 LAB — T4, FREE: Free T4: 1.74 ng/dL (ref 0.82–1.77)

## 2023-09-11 ENCOUNTER — Ambulatory Visit (INDEPENDENT_AMBULATORY_CARE_PROVIDER_SITE_OTHER): Payer: BC Managed Care – PPO | Admitting: Family Medicine

## 2023-09-20 ENCOUNTER — Other Ambulatory Visit: Payer: Self-pay

## 2023-09-20 ENCOUNTER — Other Ambulatory Visit (HOSPITAL_COMMUNITY): Payer: Self-pay

## 2023-09-21 ENCOUNTER — Ambulatory Visit (INDEPENDENT_AMBULATORY_CARE_PROVIDER_SITE_OTHER): Payer: BC Managed Care – PPO | Admitting: Family Medicine

## 2023-10-10 ENCOUNTER — Ambulatory Visit (INDEPENDENT_AMBULATORY_CARE_PROVIDER_SITE_OTHER): Payer: BC Managed Care – PPO | Admitting: Family Medicine

## 2023-10-10 ENCOUNTER — Other Ambulatory Visit (HOSPITAL_BASED_OUTPATIENT_CLINIC_OR_DEPARTMENT_OTHER): Payer: Self-pay

## 2023-10-10 ENCOUNTER — Encounter (INDEPENDENT_AMBULATORY_CARE_PROVIDER_SITE_OTHER): Payer: Self-pay | Admitting: Family Medicine

## 2023-10-10 VITALS — BP 116/80 | HR 67 | Temp 98.0°F | Ht 69.0 in | Wt 187.0 lb

## 2023-10-10 DIAGNOSIS — Z6827 Body mass index (BMI) 27.0-27.9, adult: Secondary | ICD-10-CM

## 2023-10-10 DIAGNOSIS — Z6828 Body mass index (BMI) 28.0-28.9, adult: Secondary | ICD-10-CM

## 2023-10-10 DIAGNOSIS — E66811 Obesity, class 1: Secondary | ICD-10-CM

## 2023-10-10 DIAGNOSIS — E038 Other specified hypothyroidism: Secondary | ICD-10-CM | POA: Diagnosis not present

## 2023-10-10 DIAGNOSIS — E7849 Other hyperlipidemia: Secondary | ICD-10-CM

## 2023-10-10 DIAGNOSIS — R7303 Prediabetes: Secondary | ICD-10-CM | POA: Diagnosis not present

## 2023-10-10 DIAGNOSIS — E559 Vitamin D deficiency, unspecified: Secondary | ICD-10-CM | POA: Diagnosis not present

## 2023-10-10 DIAGNOSIS — E6609 Other obesity due to excess calories: Secondary | ICD-10-CM

## 2023-10-10 MED ORDER — VITAMIN D (ERGOCALCIFEROL) 1.25 MG (50000 UNIT) PO CAPS
50000.0000 [IU] | ORAL_CAPSULE | ORAL | 0 refills | Status: DC
Start: 2023-10-10 — End: 2023-10-10

## 2023-10-10 MED ORDER — ZEPBOUND 7.5 MG/0.5ML ~~LOC~~ SOAJ
7.5000 mg | SUBCUTANEOUS | 1 refills | Status: DC
  Filled 2023-10-10: qty 2, 28d supply, fill #0
  Filled 2023-11-16: qty 2, 28d supply, fill #1

## 2023-10-10 MED ORDER — VITAMIN D (ERGOCALCIFEROL) 1.25 MG (50000 UNIT) PO CAPS
ORAL_CAPSULE | ORAL | 0 refills | Status: DC
Start: 2023-10-10 — End: 2023-12-05

## 2023-10-10 NOTE — Progress Notes (Signed)
 Brett Wilcox, D.O.  ABFM, ABOM Specializing in Clinical Bariatric Medicine  Office located at: [address] [place], [place]  [address]   Assessment and Plan:   FOR THE DISEASE OF OBESITY: BMI 28.0-28.9,adult- current bmi 27.6 Class 1 obesity due to excess calories with serious comorbidity and body mass index (BMI) of 33.0 to 33.9 in adult Assessment & Plan: Since last office visit on 08/28/23 patient's  muscle mass has increased by 0.2 lb. Fat mass has increased by 2 lb. Total body water has decreased by 1.4 lb.  Counseling done on how various foods will affect these numbers and how to maximize success  Total lbs lost to date: 39 lbs  Total weight loss percentage to date: 17.26%    Recommended Dietary Goals Brett Wilcox is currently in the action stage of change. As such, his goal is to continue weight management plan.  He has agreed to: continue current plan   Behavioral Intervention We discussed the following today: continue to work on maintaining a reduced calorie state, getting the recommended amount of protein, incorporating whole foods, making healthy choices, staying well hydrated and practicing mindfulness when eating.  Additional resources provided today: None  Evidence-based interventions for health behavior change were utilized today including the discussion of self monitoring techniques, problem-solving barriers and SMART goal setting techniques.   Regarding patient's less desirable eating habits and patterns, we employed the technique of small changes.   Pt will specifically work on: n/a   Recommended Physical Activity Goals Brett Wilcox has been advised to work up to 150 minutes of moderate intensity aerobic activity a week and strengthening exercises 2-3 times per week for cardiovascular health, weight loss maintenance and preservation of muscle mass.   He has agreed to : Continue current level of physical activity    Pharmacotherapy: Due to pt having  increased cravings at night, we mutually agreed to increase Zepbound  to 7.5 mg once a week.    FOR ASSOCIATED CONDITIONS ADDRESSED TODAY:  Other orders -     Zepbound ; Inject 7.5 mg into the skin once a week.  Dispense: 2 mL; Refill: 1   Pre-diabetes Assessment & Plan: Most recent labs: Lab Results  Component Value Date   HGBA1C 5.3 08/28/2023   HGBA1C 5.4 01/26/2023   HGBA1C 5.4 01/27/2022   INSULIN  7.3 08/28/2023   INSULIN  6.3 01/26/2023   INSULIN  13.6 01/27/2022    Lab Results  Component Value Date   CREATININE 0.87 08/28/2023   BUN 17 08/28/2023   NA 141 08/28/2023   K 4.5 08/28/2023   CL 100 08/28/2023   CO2 25 08/28/2023      Component Value Date/Time   PROT 7.0 08/28/2023 1010   ALBUMIN 4.8 08/28/2023 1010   AST 30 08/28/2023 1010   ALT 38 08/28/2023 1010   ALKPHOS 59 08/28/2023 1010   BILITOT 0.5 08/28/2023 1010    Hemoglobin A1c has improved from 5.4 to 5.3. Goal fasting insulin  is <5. Kidney function, electrolytes, liver enzymes are within recommended limits. Continue with weight loss therapy via [facility]. Will increase Zepbound  to 7.5 mg.    Other hyperlipidemia Assessment & Plan: Lab Results  Component Value Date   CHOL 138 08/28/2023   HDL 61 08/28/2023   LDLCALC 63 08/28/2023   TRIG 66 08/28/2023   CHOLHDL 2.3 08/28/2023   Hyperlipidemia treated with Crestor 10 mg daily. HDL is at goal > 60. Other components also within recommended limits. Continue with our heart-healthy, low cholesterol meal plan  and statin therapy at current dose.    Other specified hypothyroidism Assessment & Plan: Condition treated with Synthroid 88 mcg daily. Most recent thyroid  labs were stable, no concerns in this regard. Continue with medication at current dose, will continue to monitor condition.    Vitamin D  deficiency Assessment & Plan: Lab Results  Component Value Date   VD25OH 98.1 08/28/2023   VD25OH 61.0 01/26/2023   VD25OH 63.8 01/27/2022   Vitamin D  levels  are high-normal. He is currently on ERGO 50,000 units once a week without any adverse SE. Pt instructed to take ERGO every 10 days. Will continue to monitor condition.   Orders: -     Vitamin D  (Ergocalciferol ); 1 po q 10 days  Dispense: 12 capsule; Refill: 0   Follow up:   Return 12/05/2023. He was informed of the importance of frequent follow up visits to maximize his success with intensive lifestyle modifications for his multiple health conditions.  Subjective:   Chief complaint: Obesity Brett Wilcox is here to discuss his progress with his obesity treatment plan. He is on the Category 2 Plan with breakfast and lunch options with 6 oz at lunch and 8-10 oz at dinner and states he is following his eating plan approximately 70% of the time. He states he is weight lifting 45-60 minutes, 3 days a week and playing pickle ball/tennis 90 minutes, 2 days a week.   Interval History:  Brett Wilcox is here for a follow up office visit. Since last OV, Brett Wilcox is up 3 lbs. He endorses eating off plan for 2 weeks over the holiday period. He is relatively back on track with his meal plan. Reports having increased cravings at night, he would like to increase his Zepbound . Pt meeting PCP tomorrow.   Pharmacotherapy for weight loss: He is currently taking  Zepbound  5 mg once a week  Review of Systems:  Pertinent positives were addressed with patient today.  Reviewed by clinician on day of visit: allergies, medications, problem list, medical history, surgical history, family history, social history, and previous encounter notes.  Weight Summary and Biometrics   Weight Lost Since Last Visit: 0lb  Weight Gained Since Last Visit: 3lb   Vitals Temp: 98 F (36.7 C) BP: 116/80 Pulse Rate: 67 SpO2: 98 %   Anthropometric Measurements Height: 5' 9 (1.753 m) Weight: 187 lb (84.8 kg) BMI (Calculated): 27.6 Weight at Last Visit: 184lb Weight Lost Since Last Visit: 0lb Weight Gained Since Last  Visit: 3lb Starting Weight: 226lb Total Weight Loss (lbs): 39 lb (17.7 kg) Peak Weight: 226lb   Body Composition  Body Fat %: 23.7 % Fat Mass (lbs): 44.4 lbs Muscle Mass (lbs): 135.6 lbs Total Body Water (lbs): 91.4 lbs Visceral Fat Rating : 12   Other Clinical Data Fasting: no Labs: no Today's Visit #: 31 Starting Date: 02/25/21   Objective:   PHYSICAL EXAM: Blood pressure 116/80, pulse 67, temperature 98 F (36.7 C), height 5' 9 (1.753 m), weight 187 lb (84.8 kg), SpO2 98%. Body mass index is 27.62 kg/m.  General: he is overweight, cooperative and in no acute distress. PSYCH: Has normal mood, affect and thought process.   HEENT: EOMI, sclerae are anicteric. Lungs: Normal breathing effort, no conversational dyspnea. Extremities: Moves * 4 Neurologic: A and O * 3, good insight  DIAGNOSTIC DATA REVIEWED: BMET    Component Value Date/Time   NA 141 08/28/2023 1010   K 4.5 08/28/2023 1010   CL 100 08/28/2023 1010   CO2 25  08/28/2023 1010   GLUCOSE 79 08/28/2023 1010   GLUCOSE 85 11/17/2017 1331   BUN 17 08/28/2023 1010   CREATININE 0.87 08/28/2023 1010   CALCIUM 9.6 08/28/2023 1010   Lab Results  Component Value Date   HGBA1C 5.3 08/28/2023   HGBA1C 6.1 (H) 02/25/2021   Lab Results  Component Value Date   INSULIN  7.3 08/28/2023   INSULIN  23.8 02/25/2021   Lab Results  Component Value Date   TSH 2.620 08/28/2023   CBC    Component Value Date/Time   WBC 6.0 06/16/2021 0855   RBC 6.56 (H) 06/16/2021 0855   HGB 19.4 (H) 06/16/2021 0855   HCT 58.7 (H) 06/16/2021 0855   PLT 222 06/16/2021 0855   MCV 90 06/16/2021 0855   MCH 29.6 06/16/2021 0855   MCHC 33.0 06/16/2021 0855   RDW 14.7 06/16/2021 0855   Iron Studies No results found for: IRON, TIBC, FERRITIN, IRONPCTSAT Lipid Panel     Component Value Date/Time   CHOL 138 08/28/2023 1010   TRIG 66 08/28/2023 1010   HDL 61 08/28/2023 1010   CHOLHDL 2.3 08/28/2023 1010   LDLCALC 63  08/28/2023 1010   Hepatic Function Panel     Component Value Date/Time   PROT 7.0 08/28/2023 1010   ALBUMIN 4.8 08/28/2023 1010   AST 30 08/28/2023 1010   ALT 38 08/28/2023 1010   ALKPHOS 59 08/28/2023 1010   BILITOT 0.5 08/28/2023 1010      Component Value Date/Time   TSH 2.620 08/28/2023 1010   Nutritional Lab Results  Component Value Date   VD25OH 98.1 08/28/2023   VD25OH 61.0 01/26/2023   VD25OH 63.8 01/27/2022    Attestations:   I, Special Puri, acting as a stage manager for [employer], DO., have compiled all relevant documentation for today's office visit on behalf of Brett Jenkins, DO, while in the presence of [employer], DO.  Reviewed by clinician on day of visit: allergies, medications, problem list, medical history, surgical history, family history, social history, and previous encounter notes pertinent to patient's obesity diagnosis. I have spent 40  minutes in the care of the patient today including: preparing to see patient (e.g. review and interpretation of tests, old notes ), obtaining and/or reviewing separately obtained history, performing a medically appropriate examination or evaluation, counseling and educating the patient, ordering medications, test or procedures, documenting clinical information in the electronic or other health care record, and independently interpreting results and communicating results to the patient, family, or caregiver   I have reviewed the above documentation for accuracy and completeness, and I agree with the above. Brett JINNY Wilcox, D.O.  The 21st Century Cures Act was signed into law in 2016 which includes the topic of electronic health records.  This provides immediate access to information in MyChart.  This includes consultation notes, operative notes, office notes, lab results and pathology reports.  If you have any questions about what you read please let us  know at your next visit so we can discuss your concerns and  take corrective action if need be.  We are right here with you.

## 2023-10-11 DIAGNOSIS — Z5181 Encounter for therapeutic drug level monitoring: Secondary | ICD-10-CM | POA: Diagnosis not present

## 2023-10-11 DIAGNOSIS — Z23 Encounter for immunization: Secondary | ICD-10-CM | POA: Diagnosis not present

## 2023-10-11 DIAGNOSIS — I10 Essential (primary) hypertension: Secondary | ICD-10-CM | POA: Diagnosis not present

## 2023-10-11 DIAGNOSIS — M1A00X0 Idiopathic chronic gout, unspecified site, without tophus (tophi): Secondary | ICD-10-CM | POA: Diagnosis not present

## 2023-10-11 DIAGNOSIS — Z0000 Encounter for general adult medical examination without abnormal findings: Secondary | ICD-10-CM | POA: Diagnosis not present

## 2023-10-11 DIAGNOSIS — Z125 Encounter for screening for malignant neoplasm of prostate: Secondary | ICD-10-CM | POA: Diagnosis not present

## 2023-10-11 DIAGNOSIS — E291 Testicular hypofunction: Secondary | ICD-10-CM | POA: Diagnosis not present

## 2023-10-11 DIAGNOSIS — N200 Calculus of kidney: Secondary | ICD-10-CM | POA: Diagnosis not present

## 2023-11-06 ENCOUNTER — Telehealth (INDEPENDENT_AMBULATORY_CARE_PROVIDER_SITE_OTHER): Payer: Self-pay | Admitting: Family Medicine

## 2023-11-06 NOTE — Telephone Encounter (Signed)
Pharmacy called pt need refill on levocetrizine and montelukast

## 2023-11-16 ENCOUNTER — Other Ambulatory Visit (HOSPITAL_BASED_OUTPATIENT_CLINIC_OR_DEPARTMENT_OTHER): Payer: Self-pay

## 2023-12-04 DIAGNOSIS — R972 Elevated prostate specific antigen [PSA]: Secondary | ICD-10-CM | POA: Diagnosis not present

## 2023-12-05 ENCOUNTER — Ambulatory Visit (INDEPENDENT_AMBULATORY_CARE_PROVIDER_SITE_OTHER): Payer: BC Managed Care – PPO | Admitting: Family Medicine

## 2023-12-05 ENCOUNTER — Encounter (INDEPENDENT_AMBULATORY_CARE_PROVIDER_SITE_OTHER): Payer: Self-pay | Admitting: Family Medicine

## 2023-12-05 ENCOUNTER — Other Ambulatory Visit (HOSPITAL_BASED_OUTPATIENT_CLINIC_OR_DEPARTMENT_OTHER): Payer: Self-pay

## 2023-12-05 VITALS — BP 131/73 | HR 72 | Temp 98.0°F | Ht 69.0 in | Wt 184.0 lb

## 2023-12-05 DIAGNOSIS — E66811 Obesity, class 1: Secondary | ICD-10-CM

## 2023-12-05 DIAGNOSIS — E6609 Other obesity due to excess calories: Secondary | ICD-10-CM

## 2023-12-05 DIAGNOSIS — R7303 Prediabetes: Secondary | ICD-10-CM

## 2023-12-05 DIAGNOSIS — Z6827 Body mass index (BMI) 27.0-27.9, adult: Secondary | ICD-10-CM | POA: Diagnosis not present

## 2023-12-05 DIAGNOSIS — E559 Vitamin D deficiency, unspecified: Secondary | ICD-10-CM | POA: Diagnosis not present

## 2023-12-05 MED ORDER — ZEPBOUND 7.5 MG/0.5ML ~~LOC~~ SOAJ
7.5000 mg | SUBCUTANEOUS | 1 refills | Status: DC
  Filled 2023-12-05 – 2023-12-09 (×3): qty 2, 28d supply, fill #0
  Filled 2024-01-06 – 2024-01-15 (×2): qty 2, 28d supply, fill #1

## 2023-12-05 MED ORDER — VITAMIN D (ERGOCALCIFEROL) 1.25 MG (50000 UNIT) PO CAPS
ORAL_CAPSULE | ORAL | 0 refills | Status: DC
Start: 2023-12-05 — End: 2024-02-07

## 2023-12-05 NOTE — Progress Notes (Signed)
 [name], D.O.  ABFM, ABOM Specializing in Clinical Bariatric Medicine  Office located at: [address] [place], [place]  [address]   Assessment and Plan:   Medications Discontinued During This Encounter  Medication Reason   tirzepatide (ZEPBOUND) 7.5 MG/0.5ML Pen Reorder   Vitamin D, Ergocalciferol, (DRISDOL) 1.25 MG (50000 UNIT) CAPS capsule Reorder     Meds ordered this encounter  Medications   Vitamin D, Ergocalciferol, (DRISDOL) 1.25 MG (50000 UNIT) CAPS capsule    Sig: 1 po q 10 days    Dispense:  12 capsule    Refill:  0   tirzepatide (ZEPBOUND) 7.5 MG/0.5ML Pen    Sig: Inject 7.5 mg into the skin once a week.    Dispense:  2 mL    Refill:  1     FOR THE DISEASE OF OBESITY:  Class 1 obesity due to excess calories without serious comorbidity with body mass index (BMI) of 33.0 to 33.9 in adult, current BMI 27.16 Assessment & Plan: Since last office visit on 10/10/2023 patient's  Muscle mass has decreased by 1.2 lb. Fat mass has decreased by 1.8 lb. Total body water has decreased by 1.4 lb.  Counseling done on how various foods will affect these numbers and how to maximize success  Total lbs lost to date: 42 lbs  Total weight loss percentage to date: 18.58%    Recommended Dietary Goals Brett Wilcox is currently in the action stage of change. As such, his goal is to continue weight management plan.  He has agreed to: continue current plan   Behavioral Intervention We discussed the following today: visceral adiposity  Additional resources provided today: None  Evidence-based interventions for health behavior change were utilized today including the discussion of self monitoring techniques, problem-solving barriers and SMART goal setting techniques.   Regarding patient's less desirable eating habits and patterns, we employed the technique of small changes.   Pt will specifically work on: n/a   Recommended Physical Activity Goals Brett Wilcox has been advised  to work up to 150 minutes of moderate intensity aerobic activity a week and strengthening exercises 2-3 times per week for cardiovascular health, weight loss maintenance and preservation of muscle mass.   He has agreed to : Continue current level of physical activity    Pharmacotherapy We both agreed to : continue with nutritional and behavioral strategies and maintain with GLP-1 therapy.   FOR ASSOCIATED CONDITIONS ADDRESSED TODAY:  Pre-diabetes Assessment & Plan: Pt is on a regimen of Zepbound 7.5 mg weekly. He administers the Zepbound on Thursdays. On Fridays, he experiences tolerable headaches and difficulty getting in his foods. The following day and for the rest of the week these issues do not persist. Pt desires to continue the Zepbound and we will not alter the dose today. Continue implementation of medically supervised weight management plan   Relevant Orders:  -     Zepbound; Inject 7.5 mg into the skin once a week.  Dispense: 2 mL; Refill: 1   Vitamin D deficiency Assessment & Plan: Brett Wilcox has been on ERGO 50,000 units once every 10 days since 10/10/2023. Continue prescribed supplement - recheck Vit D next OV.  Relevant Orders:  -     Vitamin D (Ergocalciferol); 1 po q 10 days  Dispense: 12 capsule; Refill: 0   Follow up:   Return 02/07/2024.  Needs reck of Vit D.  He was informed of the importance of frequent follow up visits to maximize his success with intensive  lifestyle modifications for his multiple health conditions.  Subjective:   Chief complaint: Obesity Brett Wilcox is here to discuss his progress with his obesity treatment plan. He is on the Category 2 Plan with breakfast and lunch options with 6 oz at lunch and 8-10 oz at dinner and states he is following his eating plan approximately 75% of the time. He states he is doing weights 60 minutes 3 days per week & playing tennis or pickleball 90 minutes 2 days per week.   Interval History:  Brett Wilcox is here  for a follow up office visit. Since last OV on 10/10/2023, Brett Wilcox is down 3 lbs. Overall he is disciplined with his meal plan and protein intake. His hunger/cravings are well controlled.   Pharmacotherapy for weight loss: He is currently taking  Zepbound 7.5 mg weekly .   Review of Systems:  Pertinent positives were addressed with patient today.  Reviewed by clinician on day of visit: allergies, medications, problem list, medical history, surgical history, family history, social history, and previous encounter notes.  Weight Summary and Biometrics   Weight Lost Since Last Visit: 3 lb  Weight Gained Since Last Visit: 0   Vitals Temp: 98 F (36.7 C) BP: 131/73 Pulse Rate: 72 SpO2: 96 %   Anthropometric Measurements Height: 5\' 9"  (1.753 m) Weight: 184 lb (83.5 kg) BMI (Calculated): 27.16 Weight at Last Visit: 187 lb Weight Lost Since Last Visit: 3 lb Weight Gained Since Last Visit: 0 Starting Weight: 226 lb Total Weight Loss (lbs): 42 lb (19.1 kg) Peak Weight: 226 lb   Body Composition  Body Fat %: 23.1 % Fat Mass (lbs): 42.6 lbs Muscle Mass (lbs): 134.4 lbs Total Body Water (lbs): 90 lbs Visceral Fat Rating : 12   Other Clinical Data Fasting: No Labs: No Today's Visit #: 32 Starting Date: 02/25/21   Objective:   PHYSICAL EXAM: Blood pressure 131/73, pulse 72, temperature 98 F (36.7 C), height 5\' 9"  (1.753 m), weight 184 lb (83.5 kg), SpO2 96%. Body mass index is 27.17 kg/m.  General: he is overweight, cooperative and in no acute distress. PSYCH: Has normal mood, affect and thought process.   HEENT: EOMI, sclerae are anicteric. Lungs: Normal breathing effort, no conversational dyspnea. Extremities: Moves * 4 Neurologic: A and O * 3, good insight  DIAGNOSTIC DATA REVIEWED: BMET    Component Value Date/Time   NA 141 08/28/2023 1010   K 4.5 08/28/2023 1010   CL 100 08/28/2023 1010   CO2 25 08/28/2023 1010   GLUCOSE 79 08/28/2023 1010   GLUCOSE  85 11/17/2017 1331   BUN 17 08/28/2023 1010   CREATININE 0.87 08/28/2023 1010   CALCIUM 9.6 08/28/2023 1010   Lab Results  Component Value Date   HGBA1C 5.3 08/28/2023   HGBA1C 6.1 (H) 02/25/2021   Lab Results  Component Value Date   INSULIN 7.3 08/28/2023   INSULIN 23.8 02/25/2021   Lab Results  Component Value Date   TSH 2.620 08/28/2023   CBC    Component Value Date/Time   WBC 6.0 06/16/2021 0855   RBC 6.56 (H) 06/16/2021 0855   HGB 19.4 (H) 06/16/2021 0855   HCT 58.7 (H) 06/16/2021 0855   PLT 222 06/16/2021 0855   MCV 90 06/16/2021 0855   MCH 29.6 06/16/2021 0855   MCHC 33.0 06/16/2021 0855   RDW 14.7 06/16/2021 0855   Iron Studies No results found for: "IRON", "TIBC", "FERRITIN", "IRONPCTSAT" Lipid Panel     Component Value Date/Time  CHOL 138 08/28/2023 1010   TRIG 66 08/28/2023 1010   HDL 61 08/28/2023 1010   CHOLHDL 2.3 08/28/2023 1010   LDLCALC 63 08/28/2023 1010   Hepatic Function Panel     Component Value Date/Time   PROT 7.0 08/28/2023 1010   ALBUMIN 4.8 08/28/2023 1010   AST 30 08/28/2023 1010   ALT 38 08/28/2023 1010   ALKPHOS 59 08/28/2023 1010   BILITOT 0.5 08/28/2023 1010      Component Value Date/Time   TSH 2.620 08/28/2023 1010   Nutritional Lab Results  Component Value Date   VD25OH 98.1 08/28/2023   VD25OH 61.0 01/26/2023   VD25OH 63.8 01/27/2022    Attestations:   I, Special Puri, acting as a Stage manager for [employer], DO., have compiled all relevant documentation for today's office visit on behalf of Thomasene Lot, DO, while in the presence of [employer], DO.  I have reviewed the above documentation for accuracy and completeness, and I agree with the above. [name], D.O.  The 21st Century Cures Act was signed into law in 2016 which includes the topic of electronic health records.  This provides immediate access to information in MyChart.  This includes consultation notes, operative notes, office  notes, lab results and pathology reports.  If you have any questions about what you read please let us know at your next visit so we can discuss your concerns and take corrective action if need be.  We are right here with you.

## 2023-12-06 ENCOUNTER — Other Ambulatory Visit (HOSPITAL_BASED_OUTPATIENT_CLINIC_OR_DEPARTMENT_OTHER): Payer: Self-pay

## 2023-12-07 DIAGNOSIS — G4733 Obstructive sleep apnea (adult) (pediatric): Secondary | ICD-10-CM | POA: Diagnosis not present

## 2023-12-09 ENCOUNTER — Other Ambulatory Visit (INDEPENDENT_AMBULATORY_CARE_PROVIDER_SITE_OTHER): Payer: Self-pay | Admitting: Family Medicine

## 2023-12-09 ENCOUNTER — Other Ambulatory Visit (HOSPITAL_BASED_OUTPATIENT_CLINIC_OR_DEPARTMENT_OTHER): Payer: Self-pay

## 2023-12-09 DIAGNOSIS — E559 Vitamin D deficiency, unspecified: Secondary | ICD-10-CM

## 2023-12-11 ENCOUNTER — Other Ambulatory Visit (HOSPITAL_BASED_OUTPATIENT_CLINIC_OR_DEPARTMENT_OTHER): Payer: Self-pay

## 2023-12-12 ENCOUNTER — Other Ambulatory Visit (HOSPITAL_BASED_OUTPATIENT_CLINIC_OR_DEPARTMENT_OTHER): Payer: Self-pay

## 2023-12-25 DIAGNOSIS — G4733 Obstructive sleep apnea (adult) (pediatric): Secondary | ICD-10-CM | POA: Diagnosis not present

## 2024-01-06 ENCOUNTER — Other Ambulatory Visit (HOSPITAL_BASED_OUTPATIENT_CLINIC_OR_DEPARTMENT_OTHER): Payer: Self-pay

## 2024-01-08 DIAGNOSIS — G4733 Obstructive sleep apnea (adult) (pediatric): Secondary | ICD-10-CM | POA: Diagnosis not present

## 2024-01-15 ENCOUNTER — Other Ambulatory Visit (HOSPITAL_BASED_OUTPATIENT_CLINIC_OR_DEPARTMENT_OTHER): Payer: Self-pay

## 2024-01-23 DIAGNOSIS — M79645 Pain in left finger(s): Secondary | ICD-10-CM | POA: Diagnosis not present

## 2024-01-23 DIAGNOSIS — R7303 Prediabetes: Secondary | ICD-10-CM | POA: Diagnosis not present

## 2024-01-23 DIAGNOSIS — I10 Essential (primary) hypertension: Secondary | ICD-10-CM | POA: Diagnosis not present

## 2024-01-23 DIAGNOSIS — M109 Gout, unspecified: Secondary | ICD-10-CM | POA: Diagnosis not present

## 2024-01-23 DIAGNOSIS — E785 Hyperlipidemia, unspecified: Secondary | ICD-10-CM | POA: Diagnosis not present

## 2024-01-23 DIAGNOSIS — E039 Hypothyroidism, unspecified: Secondary | ICD-10-CM | POA: Diagnosis not present

## 2024-01-23 DIAGNOSIS — J309 Allergic rhinitis, unspecified: Secondary | ICD-10-CM | POA: Diagnosis not present

## 2024-01-23 LAB — BASIC METABOLIC PANEL WITH GFR
BUN: 16 (ref 4–21)
CO2: 31 — AB (ref 13–22)
Chloride: 102 (ref 99–108)
Creatinine: 0.9 (ref 0.6–1.3)
Glucose: 94
Potassium: 4.2 meq/L (ref 3.5–5.1)
Sodium: 140 (ref 137–147)

## 2024-01-23 LAB — LIPID PANEL
Cholesterol: 141 (ref 0–200)
HDL: 59 (ref 35–70)
LDL Cholesterol: 68
Triglycerides: 89 (ref 40–160)

## 2024-01-23 LAB — HEPATIC FUNCTION PANEL
ALT: 39 U/L (ref 10–40)
AST: 29 (ref 14–40)
Alkaline Phosphatase: 53 (ref 25–125)
Bilirubin, Total: 1.2

## 2024-01-23 LAB — CBC AND DIFFERENTIAL
HCT: 49 (ref 41–53)
WBC: 5

## 2024-01-23 LAB — TSH: TSH: 2.71 (ref 0.41–5.90)

## 2024-01-23 LAB — CBC: RBC: 5.51 — AB (ref 3.87–5.11)

## 2024-01-23 LAB — COMPREHENSIVE METABOLIC PANEL WITH GFR
Albumin: 4.9 (ref 3.5–5.0)
Calcium: 10.1 (ref 8.7–10.7)
eGFR: 101

## 2024-01-23 LAB — HEMOGLOBIN A1C: Hemoglobin A1C: 5.1

## 2024-01-23 LAB — VITAMIN D 25 HYDROXY (VIT D DEFICIENCY, FRACTURES): Vit D, 25-Hydroxy: 113.4

## 2024-02-07 ENCOUNTER — Ambulatory Visit (INDEPENDENT_AMBULATORY_CARE_PROVIDER_SITE_OTHER): Admitting: Family Medicine

## 2024-02-07 ENCOUNTER — Other Ambulatory Visit (HOSPITAL_BASED_OUTPATIENT_CLINIC_OR_DEPARTMENT_OTHER): Payer: Self-pay

## 2024-02-07 ENCOUNTER — Encounter (INDEPENDENT_AMBULATORY_CARE_PROVIDER_SITE_OTHER): Payer: Self-pay | Admitting: Family Medicine

## 2024-02-07 ENCOUNTER — Other Ambulatory Visit (INDEPENDENT_AMBULATORY_CARE_PROVIDER_SITE_OTHER): Payer: Self-pay

## 2024-02-07 VITALS — BP 111/72 | HR 68 | Temp 98.0°F | Ht 69.0 in | Wt 178.0 lb

## 2024-02-07 DIAGNOSIS — E559 Vitamin D deficiency, unspecified: Secondary | ICD-10-CM

## 2024-02-07 DIAGNOSIS — R7303 Prediabetes: Secondary | ICD-10-CM

## 2024-02-07 DIAGNOSIS — E66811 Obesity, class 1: Secondary | ICD-10-CM

## 2024-02-07 DIAGNOSIS — M2011 Hallux valgus (acquired), right foot: Secondary | ICD-10-CM

## 2024-02-07 DIAGNOSIS — Z6826 Body mass index (BMI) 26.0-26.9, adult: Secondary | ICD-10-CM

## 2024-02-07 MED ORDER — ZEPBOUND 7.5 MG/0.5ML ~~LOC~~ SOAJ
7.5000 mg | SUBCUTANEOUS | 1 refills | Status: DC
  Filled 2024-02-07: qty 2, 28d supply, fill #0
  Filled 2024-03-07: qty 2, 28d supply, fill #1

## 2024-02-07 NOTE — Progress Notes (Signed)
 Rae Bugler, D.O.  ABFM, ABOM Specializing in Clinical Bariatric Medicine  Office located at: [address] [place], [place]  [address]   Assessment and Plan:  No orders of the defined types were placed in this encounter.  Medications Discontinued During This Encounter  Medication Reason   Vitamin D , Ergocalciferol , (DRISDOL ) 1.25 MG (50000 UNIT) CAPS capsule    tirzepatide  (ZEPBOUND ) 7.5 MG/0.5ML Pen Reorder    Meds ordered this encounter  Medications   tirzepatide  (ZEPBOUND ) 7.5 MG/0.5ML Pen    Sig: Inject 7.5 mg into the skin once a week.    Dispense:  2 mL    Refill:  1     FOR THE DISEASE OF OBESITY:  BMI 26.0-26.9,adult - current 26.27 Class 1 obesity due to excess calories with serious comorbidity and body mass index (BMI) of 33.0 to 33.9 in adult - start 33.36 Assessment & Plan: Since last office visit on 12/05/2023, patient's muscle mass has decreased by 3.2 lbs. Fat mass has decreased by 2.6 lbs. Total body water has increased by 0.2 lbs.  Counseling done on how various foods will affect these numbers and how to maximize success  Total lbs lost to date: 48 lbs Total weight loss percentage to date: 21.24%    Recommended Dietary Goals Julious is currently in the action stage of change. As such, his goal is to continue weight management plan.  He has agreed to: continue current plan   Behavioral Intervention We discussed the following today: Best sources of protein to maintain muscle mass  Additional resources provided today: None  Evidence-based interventions for health behavior change were utilized today including the discussion of self monitoring techniques, problem-solving barriers and SMART goal setting techniques.   Regarding patient's less desirable eating habits and patterns, we employed the technique of small changes.   Pt will specifically work on: n/a   Recommended Physical Activity Goals Taron has been advised to work up to 300-450  minutes of moderate intensity aerobic activity a week and strengthening exercises 2-3 times per week for cardiovascular health, weight loss maintenance and preservation of muscle mass.   He has agreed to :  Continue current level of physical activity    Pharmacotherapy We both agreed to : continue with nutritional and behavioral strategies and current medication regimen   ASSOCIATED CONDITIONS ADDRESSED TODAY:  Pre-diabetes Assessment & Plan: Tavan is taking Zepbound  7.5 mg once weekly. Tolerating well, hunger/cravings well controlled. On 01/23/2024, pt received labs from the [place]. Lipid, CBC, CMP, and thyroid  levels all WNL. A1c improved from 5.3 to 5.1. Continue current medication regimen. Continue following prudent nutrition plan that is high in protein and low in simple carbs. Will continue to monitor closely.   Relevant Orders: -     Zepbound ; Inject 7.5 mg into the skin once a week.  Dispense: 2 mL; Refill: 1   Vitamin D  deficiency Assessment & Plan: Brandin is prescribed ERGO 50,000 units once every 10 days. Most recent labs per the [place] show elevated Vit D levels at 113. Upon receiving these results, pt discontinued supplement. Informed pt that Vit D levels are above goal so will stop taking supplement today. Will recheck levels in about 3 months to observe any changes.    Hallux valgus (acquired), right foot Assessment & Plan: Bernardino recently noticed an abnormal bend in foot that has become painful. Informed pt this is likely d/t hallux valgus. Encouraged pt to ensure he is wearing properly fitting shoes. Referred pt to  podiatrist for official evaluation and treatment. Will monitor condition as it pertains to his weight loss journey.    Relevant Orders: -     Ambulatory referral to Podiatry  Follow up:   Return in about 8 weeks (around 04/03/2024). He was informed of the importance of frequent follow up visits to maximize his success with intensive lifestyle modifications for his  multiple health conditions.  Subjective:   Chief complaint: Obesity Finlay is here to discuss his progress with his obesity treatment plan. He is on the Category 2 Plan with breakfast and lunch options with 6 oz at lunch and 8-10 oz at dinner and states he is following his eating plan approximately 75% of the time. He states he is lifting weights 60 minutes 3 days per week and playing tennis or pickle ball 90  minutes 2 days per week.  Interval History:  ALANMICHAEL SPINDEL is here for a follow up office visit. Since last OV on 12/05/2023, pt is down 6 lbs. He endorses enjoying weight lifting more. Also, he is closely adhering to MP with a focus on proteins.  Pharmacotherapy for weight loss: He is currently taking Zepbound  7.5 mg once weekly.   Review of Systems:  Pertinent positives were addressed with patient today.  Reviewed by clinician on day of visit: allergies, medications, problem list, medical history, surgical history, family history, social history, and previous encounter notes.  Weight Summary and Biometrics   Weight Lost Since Last Visit: 6lb  Weight Gained Since Last Visit: 0   Vitals Temp: 98 F (36.7 C) BP: 111/72 Pulse Rate: 68 SpO2: 97 %   Anthropometric Measurements Height: 5\' 9"  (1.753 m) Weight: 178 lb (80.7 kg) BMI (Calculated): 26.27 Weight at Last Visit: 184lb Weight Lost Since Last Visit: 6lb Weight Gained Since Last Visit: 0 Starting Weight: 226lb Total Weight Loss (lbs): 48 lb (21.8 kg)   Body Composition  Body Fat %: 22.5 % Fat Mass (lbs): 40 lbs Muscle Mass (lbs): 131.2 lbs Total Body Water (lbs): 90.2 lbs Visceral Fat Rating : 11   Other Clinical Data Fasting: yes Labs: no Today's Visit #: 33 Starting Date: 02/25/21    Objective:   PHYSICAL EXAM: Blood pressure 111/72, pulse 68, temperature 98 F (36.7 C), height 5\' 9"  (1.753 m), weight 178 lb (80.7 kg), SpO2 97%. Body mass index is 26.29 kg/m.  General: he is overweight,  cooperative and in no acute distress. PSYCH: Has normal mood, affect and thought process.   HEENT: EOMI, sclerae are anicteric. Lungs: Normal breathing effort, no conversational dyspnea. Extremities: Moves * 4 Neurologic: A and O * 3, good insight  DIAGNOSTIC DATA REVIEWED: BMET    Component Value Date/Time   NA 141 08/28/2023 1010   K 4.5 08/28/2023 1010   CL 100 08/28/2023 1010   CO2 25 08/28/2023 1010   GLUCOSE 79 08/28/2023 1010   GLUCOSE 85 11/17/2017 1331   BUN 17 08/28/2023 1010   CREATININE 0.87 08/28/2023 1010   CALCIUM 9.6 08/28/2023 1010   Lab Results  Component Value Date   HGBA1C 5.3 08/28/2023   HGBA1C 6.1 (H) 02/25/2021   Lab Results  Component Value Date   INSULIN  7.3 08/28/2023   INSULIN  23.8 02/25/2021   Lab Results  Component Value Date   TSH 2.620 08/28/2023   CBC    Component Value Date/Time   WBC 6.0 06/16/2021 0855   RBC 6.56 (H) 06/16/2021 0855   HGB 19.4 (H) 06/16/2021 0855   HCT 58.7 (H)  06/16/2021 0855   PLT 222 06/16/2021 0855   MCV 90 06/16/2021 0855   MCH 29.6 06/16/2021 0855   MCHC 33.0 06/16/2021 0855   RDW 14.7 06/16/2021 0855   Iron Studies No results found for: "IRON", "TIBC", "FERRITIN", "IRONPCTSAT" Lipid Panel     Component Value Date/Time   CHOL 138 08/28/2023 1010   TRIG 66 08/28/2023 1010   HDL 61 08/28/2023 1010   CHOLHDL 2.3 08/28/2023 1010   LDLCALC 63 08/28/2023 1010   Hepatic Function Panel     Component Value Date/Time   PROT 7.0 08/28/2023 1010   ALBUMIN 4.8 08/28/2023 1010   AST 30 08/28/2023 1010   ALT 38 08/28/2023 1010   ALKPHOS 59 08/28/2023 1010   BILITOT 0.5 08/28/2023 1010      Component Value Date/Time   TSH 2.620 08/28/2023 1010   Nutritional Lab Results  Component Value Date   VD25OH 98.1 08/28/2023   VD25OH 61.0 01/26/2023   VD25OH 63.8 01/27/2022    Attestations:   I, Camryn Mix, acting as a Stage manager for [employer], DO., have compiled all relevant documentation  for today's office visit on behalf of Marceil Sensor, DO, while in the presence of [employer], DO.  I have reviewed the above documentation for accuracy and completeness, and I agree with the above. Rae Bugler, D.O.  The 21st Century Cures Act was signed into law in 2016 which includes the topic of electronic health records.  This provides immediate access to information in MyChart.  This includes consultation notes, operative notes, office notes, lab results and pathology reports.  If you have any questions about what you read please let us  know at your next visit so we can discuss your concerns and take corrective action if need be.  We are right here with you.

## 2024-02-13 DIAGNOSIS — Z79899 Other long term (current) drug therapy: Secondary | ICD-10-CM | POA: Diagnosis not present

## 2024-02-15 DIAGNOSIS — G4733 Obstructive sleep apnea (adult) (pediatric): Secondary | ICD-10-CM | POA: Diagnosis not present

## 2024-02-19 ENCOUNTER — Ambulatory Visit: Admitting: Podiatry

## 2024-02-19 ENCOUNTER — Encounter: Payer: Self-pay | Admitting: Podiatry

## 2024-02-19 ENCOUNTER — Ambulatory Visit (INDEPENDENT_AMBULATORY_CARE_PROVIDER_SITE_OTHER)

## 2024-02-19 DIAGNOSIS — M10071 Idiopathic gout, right ankle and foot: Secondary | ICD-10-CM | POA: Diagnosis not present

## 2024-02-19 DIAGNOSIS — M7751 Other enthesopathy of right foot: Secondary | ICD-10-CM | POA: Diagnosis not present

## 2024-02-19 DIAGNOSIS — M21619 Bunion of unspecified foot: Secondary | ICD-10-CM

## 2024-02-19 NOTE — Progress Notes (Signed)
 Subjective:  Patient ID: Brett Wilcox, male    DOB: 03-06-1966,  MRN: [id number]  Chief Complaint  Patient presents with   Bunions    Pt is here for bunion of the right foot.    Discussed the use of AI scribe software for clinical note transcription with the patient, who gave verbal consent to proceed.  History of Present Illness ERNAN RUNKLES is a 58 year old male with a history of gout who presents with a bunion on the right foot.  He has a bunion on the right foot, which is more noticeable over time but does not cause significant pain or interfere with ambulation and certain shoe gear. The big toe is shifting, leaning over, and this change is more apparent now.  He manages gout with allopurinol and has not had a recent flare.  No history of blood clots.  Currently no blood thinners.   ROS: Negative except otherwise noted  Past Medical History:  Diagnosis Date   Anxiety    Back pain    Chest pain    Constipation    Fatty liver    Heartburn    High cholesterol    Hypothyroidism    Joint pain    Kidney problem    Kidney stones    SOBOE (shortness of breath on exertion)    Swelling of both lower extremities     Past Surgical History:  Procedure Laterality Date   APPENDECTOMY     CYSTOSCOPY W/ URETERAL STENT PLACEMENT Bilateral 11/17/2017   Procedure: CYSTOSCOPY WITH RETROGRADE PYELOGRAM/URETERAL STENT PLACEMENT;  Surgeon: Marco Severs, MD;  Location: [facility];  Service: Urology;  Laterality: Bilateral;   HERNIA REPAIR     LITHOTRIPSY       Current Outpatient Medications:    acyclovir  (ZOVIRAX ) 400 MG tablet, Take 400 mg by mouth daily as needed., Disp: , Rfl:    acyclovir  ointment (ZOVIRAX ) 5 %, Apply 1 Application topically every 3 (three) hours., Disp: 30 g, Rfl: 0   allopurinol (ZYLOPRIM) 100 MG tablet, Take 200 mg by mouth daily., Disp: , Rfl:    fluticasone  (FLONASE ) 50 MCG/ACT nasal spray, 1 spray each nostril BID after sinus  rinse, Disp: 1 mL, Rfl: 0   hydrochlorothiazide (HYDRODIURIL) 25 MG tablet, Take 25 mg by mouth daily., Disp: , Rfl:    levocetirizine (XYZAL ) 5 MG tablet, Take 1 tablet (5 mg total) by mouth every evening., Disp: 90 tablet, Rfl: 0   levothyroxine (SYNTHROID) 88 MCG tablet, Take 88 mcg by mouth every morning., Disp: , Rfl:    montelukast  (SINGULAIR ) 10 MG tablet, Take 1 tablet (10 mg total) by mouth at bedtime., Disp: 90 tablet, Rfl: 0   rosuvastatin (CRESTOR) 10 MG tablet, Take 10 mg by mouth daily., Disp: , Rfl:    Testosterone  1.62 % GEL, Apply topically., Disp: , Rfl:    tirzepatide  (ZEPBOUND ) 7.5 MG/0.5ML Pen, Inject 7.5 mg into the skin once a week., Disp: 2 mL, Rfl: 1  No Known Allergies      Objective:    Physical Exam General: AAO x3, NAD  Dermatological: Skin is warm, dry and supple bilateral.  There are no open sores, no preulcerative lesions, no rash or signs of infection present.  Vascular: Dorsalis Pedis artery and Posterior Tibial artery pedal pulses are 2/4 bilateral with immedate capillary fill time.  There is no pain with calf compression, swelling, warmth, erythema.   Neruologic: Grossly intact via light touch bilateral.  Musculoskeletal: Moderate bunion deformity is noted in the hallux is starting to overlap the second toe.  He has tenderness palpation from the rubbing as well as on the bunion itself.  There is no crepitation with MPJ range of motion today.  There is no other areas of pinpoint tenderness.  Gait: Unassisted, Nonantalgic.     No images are attached to the encounter.    Results RADIOLOGY Foot X-ray: Protrusion of the first metatarsal bone, joint erosion, signs of gouty arthritis   Assessment:   1. Bunion      Plan:  Patient was evaluated and treated and all questions answered.  Assessment and Plan Assessment & Plan Bunion, right foot -We discussed with conservative as well as surgical treatment options.  From a conservative  standpoint we discussed supportive shoes with arch support.  Also use a toe spacer. given ongoing bunion deformity we discussed surgery as well and ultimately he wants to proceed with this.  We discussed different options and his decision for which procedure to be done will be determined intraoperatively.  We discussed more traditional bunion surgery with an Sheryle Donning versus an MTPJ arthrodesis should there be significant arthritic changes present the first MTPJ. - The incision placement as well as the postoperative course was discussed with the patient. I discussed risks of the surgery which include, but not limited to, infection, bleeding, pain, swelling, need for further surgery, delayed or nonhealing, painful or ugly scar, numbness or sensation changes, over/under correction, recurrence, transfer lesions, further deformity, hardware failure, DVT/PE, loss of toe/foot. Patient understands these risks and wishes to proceed with surgery. The surgical consent was reviewed with the patient all 3 pages were signed. No promises or guarantees were given to the outcome of the procedure. All questions were answered to the best of my ability. Before the surgery the patient was encouraged to call the office if there is any further questions. The surgery will be performed at the [facility] on an outpatient basis.  Gout Gout managed with allopurinol. Important to control to prevent joint damage. - Continue allopurinol. - Monitor for gout flare-ups.   No follow-ups on file.   Charity Conch DPM

## 2024-02-19 NOTE — Patient Instructions (Signed)
 Pre-Operative Instructions  Congratulations, you have decided to take an important step to improving your quality of life.  You can be assured that the doctors of [facility] will be with you every step of the way.  Plan to be at the surgery center/hospital at least 1 (one) hour prior to your scheduled time unless otherwise directed by the surgical center/hospital staff.  You must have a responsible adult accompany you, remain during the surgery and drive you home.  Make sure you have directions to the surgical center/hospital and know how to get there on time. For hospital based surgery you will need to obtain a history and physical form from your family physician within 1 month prior to the date of surgery- we will give you a form for you primary physician.  We make every effort to accommodate the date you request for surgery.  There are however, times where surgery dates or times have to be moved.  We will contact you as soon as possible if a change in schedule is required.   No Aspirin /Ibuprofen for one week before surgery.  If you are on aspirin , any non-steroidal anti-inflammatory medications (Mobic , Aleve, Ibuprofen) you should stop taking it 7 days prior to your surgery.  You make take Tylenol   For pain prior to surgery.  Medications- If you are taking daily heart and blood pressure medications, seizure, reflux, allergy, asthma, anxiety, pain or diabetes medications, make sure the surgery center/hospital is aware before the day of surgery so they may notify you which medications to take or avoid the day of surgery. No food or drink after midnight the night before surgery unless directed otherwise by surgical center/hospital staff. No alcoholic beverages 24 hours prior to surgery.  No smoking 24 hours prior to or 24 hours after surgery. Wear loose pants or shorts- loose enough to fit over bandages, boots, and casts. No slip on shoes, sneakers are best. Bring your boot with you to the  surgery center/hospital.  Also bring crutches or a walker if your physician has prescribed it for you.  If you do not have this equipment, it will be provided for you after surgery. If you have not been contracted by the surgery center/hospital by the day before your surgery, call to confirm the date and time of your surgery. Leave-time from work may vary depending on the type of surgery you have.  Appropriate arrangements should be made prior to surgery with your employer. Prescriptions will be provided immediately following surgery by your doctor.  Have these filled as soon as possible after surgery and take the medication as directed. Remove nail polish on the operative foot. Wash the night before surgery.  The night before surgery wash the foot and leg well with the antibacterial soap provided and water paying special attention to beneath the toenails and in between the toes.  Rinse thoroughly with water and dry well with a towel.  Perform this wash unless told not to do so by your physician.  Enclosed: 1 Ice pack (please put in freezer the night before surgery)   1 Hibiclens skin cleaner   Pre-op Instructions  If you have any questions regarding the instructions, do not hesitate to call our office at any point during this process.   [place]: [address] [place], [place] [address] [phone]  Dr. Bobbie Burows, DPM

## 2024-02-20 ENCOUNTER — Telehealth: Payer: Self-pay | Admitting: Podiatry

## 2024-02-20 NOTE — Telephone Encounter (Signed)
 Received surgical consent form.  Left message for pt to call to get the surgery scheduled with Dr Clydia Dart.

## 2024-02-22 NOTE — Telephone Encounter (Signed)
 Pt returned my call and left message for me to call him back.  I returned call and pt is scheduled for surgery on 10/09/24. I have also scheduled him on November 17th to come in to see Dr Clydia Dart to resign the consent form for the surgery as it will have expired.

## 2024-03-07 ENCOUNTER — Other Ambulatory Visit: Payer: Self-pay

## 2024-03-07 ENCOUNTER — Other Ambulatory Visit (HOSPITAL_COMMUNITY): Payer: Self-pay

## 2024-03-12 DIAGNOSIS — G4733 Obstructive sleep apnea (adult) (pediatric): Secondary | ICD-10-CM | POA: Diagnosis not present

## 2024-03-21 DIAGNOSIS — G4733 Obstructive sleep apnea (adult) (pediatric): Secondary | ICD-10-CM | POA: Diagnosis not present

## 2024-03-21 DIAGNOSIS — G4734 Idiopathic sleep related nonobstructive alveolar hypoventilation: Secondary | ICD-10-CM | POA: Diagnosis not present

## 2024-04-03 ENCOUNTER — Ambulatory Visit (INDEPENDENT_AMBULATORY_CARE_PROVIDER_SITE_OTHER): Admitting: Family Medicine

## 2024-04-03 ENCOUNTER — Encounter (INDEPENDENT_AMBULATORY_CARE_PROVIDER_SITE_OTHER): Payer: Self-pay | Admitting: Family Medicine

## 2024-04-03 ENCOUNTER — Other Ambulatory Visit (HOSPITAL_COMMUNITY): Payer: Self-pay

## 2024-04-03 ENCOUNTER — Other Ambulatory Visit: Payer: Self-pay

## 2024-04-03 VITALS — BP 104/68 | HR 78 | Temp 98.7°F | Ht 69.0 in | Wt 177.0 lb

## 2024-04-03 DIAGNOSIS — E7849 Other hyperlipidemia: Secondary | ICD-10-CM | POA: Diagnosis not present

## 2024-04-03 DIAGNOSIS — G4733 Obstructive sleep apnea (adult) (pediatric): Secondary | ICD-10-CM | POA: Diagnosis not present

## 2024-04-03 DIAGNOSIS — D582 Other hemoglobinopathies: Secondary | ICD-10-CM

## 2024-04-03 DIAGNOSIS — E66811 Obesity, class 1: Secondary | ICD-10-CM | POA: Diagnosis not present

## 2024-04-03 DIAGNOSIS — Z6826 Body mass index (BMI) 26.0-26.9, adult: Secondary | ICD-10-CM

## 2024-04-03 MED ORDER — ZEPBOUND 7.5 MG/0.5ML ~~LOC~~ SOAJ
7.5000 mg | SUBCUTANEOUS | 1 refills | Status: DC
  Filled 2024-04-03: qty 2, 28d supply, fill #0
  Filled 2024-05-06: qty 2, 28d supply, fill #1

## 2024-04-30 NOTE — Progress Notes (Signed)
 Brett Wilcox, D.O.  ABFM, ABOM Specializing in Clinical Bariatric Medicine  Office located at: [address] [place], [place]  [address]   Assessment and Plan:   Medications Discontinued During This Encounter  Medication Reason   tirzepatide  (ZEPBOUND ) 7.5 MG/0.5ML Pen Reorder    Meds ordered this encounter  Medications   tirzepatide  (ZEPBOUND ) 7.5 MG/0.5ML Pen    Sig: Inject 7.5 mg into the skin once a week.    Dispense:  2 mL    Refill:  1     FOR THE DISEASE OF OBESITY: Class 1 obesity due to excess calories with serious comorbidity and body mass index (BMI) of 33.0 to 33.9 in adult - start 33.36 BMI 26.0-26.9,adult - current 26.27 Assessment & Plan: Since last office visit on 02/07/24 patient's muscle mass has decreased by 0.8lbs. Fat mass has decreased by 0.2 lbs. Total body water has decreased by 1.2 lbs.  Counseling done on how various foods will affect these numbers and how to maximize success  Total lbs lost to date: 49 lbs Total weight loss percentage to date: -21.68 %   Recommended Dietary Goals Brett Wilcox is currently in the action stage of change. As such, his goal is to continue weight management plan.  He has agreed to: continue current plan   Behavioral Intervention We discussed the following today: continue to work on maintaining a reduced calorie state, getting the recommended amount of protein, incorporating whole foods, making healthy choices, staying well hydrated and practicing mindfulness when eating.  Additional resources provided today: Handout on Common Characteristics of Successful Weight Losers and Maintainers   Evidence-based interventions for health behavior change were utilized today including the discussion of self monitoring techniques, problem-solving barriers and SMART goal setting techniques.   Regarding patient's less desirable eating habits and patterns, we employed the technique of small changes.   Pt will specifically work on:  n/a    Recommended Physical Activity Goals Brett Wilcox has been advised to work up to 300-450 minutes of moderate intensity aerobic activity a week and strengthening exercises 2-3 times per week for cardiovascular health, weight loss maintenance and preservation of muscle mass.   He has agreed to: Continue current level of physical activity  - consider adding another day of cardio   Pharmacotherapy Pt on Zepbound  7.5 mg once weekly. Reports good compliance and tolerance. Denies any GI upset, constipation, or other adverse SE. Hunger and cravings are well controlled.   We both agreed to: Continue with current nutritional and behavioral strategies and Continue Zepbound .   ASSOCIATED CONDITIONS ADDRESSED TODAY: Other hyperlipidemia Assessment & Plan: Lab Results  Component Value Date   CHOL 141 01/23/2024   HDL 59 01/23/2024   LDLCALC 68 01/23/2024   TRIG 89 01/23/2024   CHOLHDL 2.3 08/28/2023   Currently on Rosuvastatin 10 mg once daily. Good compliance and tolerance. No adverse SE. Continue with current statin therapy as prescribed. Continue following heart healthy diet and exercising regularly. Reduce red meat consumption. Will continue monitoring alongside PCP.    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 Now donating blood 2x/year. Last donated 2 days ago on 04/08/24. Reviewed CBC w diff from 02/13/24 ordered by PCP. His RBC improved, now in normal range at 5.47. Hemoglobin decreased, almost  at goal at 17.1. Hematocrit within normal range at 48.4.   Reviewed how normal Hgb is 17 or less. Education provided on how increased levels of testosterone  causes overproduction of hemoglobin and hematocrit. Reviewed how donating blood can help reduce these hemoglobin and hematocrit  levels. Will continue monitoring alongside PCP.     OSA (obstructive sleep apnea) Assessment & Plan: Pt wanted to forego CPAP machine and went to an OMFS. He had an oral appliance provided. They repeated his sleep study, and it showed that he had a great reduction in his AHI, which results showed were adequate.  Continue use of oral appliance. Continue follow up with sleep specialists as instructed by them. Reviewed how condition is expected to continue improving with continued weight loss.     Follow up:   Return in about 8 weeks (around 05/29/2024).  He was informed of the importance of frequent follow up visits to maximize his success with intensive lifestyle modifications for his multiple health conditions.  Subjective:   Chief complaint: Obesity Brett Wilcox is here to discuss his progress with his obesity treatment plan. He is on the Category 2 Plan w B/L options; 6 ounces at lunch and 8-10 at dinner. States he is following his eating plan approximately 75% of the time. He states he is doing weights 60 minutes 3 days per week, tennis 90 minutes 1 day per week, and pickle ball 90 minutes 1 days per week.    Interval History:  Brett Wilcox is here for a follow up office visit. Since last OV on 02/07/24, he is down 1 lb. He reports going on a vacation recently and he overall was able to stay on plan. He ate a lot of fish and sandwiches. He tends to snack on fruits and veggies. Hunger and cravings are overall well controlled.    Pharmacotherapy that aid with weight loss: He is currently taking Zepbound  7.5 mg once weekly.    Review of Systems:  Pertinent positives were addressed with patient today.  Reviewed by clinician on day of visit: allergies, medications, problem list, medical history, surgical history, family history, social history, and previous encounter notes.  Weight Summary and Biometrics   *See synopsis for biometric data.*  Objective:   PHYSICAL EXAM: Blood  pressure 104/68, pulse 78, temperature 98.7 F (37.1 C), height 5' 9 (1.753 m), weight 177 lb (80.3 kg), SpO2 98%. Body mass index is 26.14 kg/m.  General: he is overweight, cooperative and in no acute distress. PSYCH: Has normal mood, affect and thought process.   HEENT: EOMI, sclerae are anicteric. Lungs: Normal breathing effort, no conversational dyspnea. Extremities: Moves * 4 Neurologic: A and O * 3, good insight  DIAGNOSTIC DATA REVIEWED: BMET    Component Value Date/Time   NA 140 01/23/2024 0941   K 4.2 01/23/2024 0941   CL 102 01/23/2024 0941   CO2 31 (A) 01/23/2024 0941   GLUCOSE 79 08/28/2023 1010   GLUCOSE 85 11/17/2017 1331   BUN 16 01/23/2024 0941   CREATININE 0.9 01/23/2024 0941   CREATININE 0.87 08/28/2023 1010   CALCIUM 10.1 01/23/2024 0941   Lab Results  Component Value Date   HGBA1C 5.1 01/23/2024   HGBA1C 6.1 (H) 02/25/2021   Lab Results  Component Value Date   INSULIN  7.3 08/28/2023   INSULIN  23.8 02/25/2021   Lab Results  Component Value Date   TSH 2.71 01/23/2024   CBC    Component Value Date/Time   WBC 5.0 01/23/2024 0941  RBC 5.51 (A) 01/23/2024 0941   HGB 19.4 (H) 06/16/2021 0855   HCT 49 01/23/2024 0941   HCT 58.7 (H) 06/16/2021 0855   PLT 222 06/16/2021 0855   MCV 90 06/16/2021 0855   MCH 29.6 06/16/2021 0855   MCHC 33.0 06/16/2021 0855   RDW 14.7 06/16/2021 0855   Iron Studies No results found for: IRON, TIBC, FERRITIN, IRONPCTSAT Lipid Panel     Component Value Date/Time   CHOL 141 01/23/2024 0941   CHOL 138 08/28/2023 1010   TRIG 89 01/23/2024 0941   HDL 59 01/23/2024 0941   HDL 61 08/28/2023 1010   CHOLHDL 2.3 08/28/2023 1010   LDLCALC 68 01/23/2024 0941   LDLCALC 63 08/28/2023 1010   Hepatic Function Panel     Component Value Date/Time   PROT 7.0 08/28/2023 1010   ALBUMIN 4.9 01/23/2024 0941   ALBUMIN 4.8 08/28/2023 1010   AST 29 01/23/2024 0941   ALT 39 01/23/2024 0941   ALKPHOS 53 01/23/2024 0941    BILITOT 0.5 08/28/2023 1010      Component Value Date/Time   TSH 2.71 01/23/2024 0941   TSH 2.620 08/28/2023 1010   Nutritional Lab Results  Component Value Date   VD25OH 113.4 01/23/2024   VD25OH 98.1 08/28/2023   VD25OH 61.0 01/26/2023    Attestations:   I, Brett Wilcox, acting as a Stage manager for Brett Jenkins, DO., have compiled all relevant documentation for today's office visit on behalf of Brett Jenkins, DO, while in the presence of [employer], DO.   I have reviewed the above documentation for accuracy and completeness, and I agree with the above. Brett JINNY Wilcox, D.O.  The 21st Century Cures Act was signed into law in 2016 which includes the topic of electronic health records.  This provides immediate access to information in MyChart.  This includes consultation notes, operative notes, office notes, lab results and pathology reports.  If you have any questions about what you read please let us  know at your next visit so we can discuss your concerns and take corrective action if need be.  We are right here with you.

## 2024-05-06 ENCOUNTER — Other Ambulatory Visit (HOSPITAL_BASED_OUTPATIENT_CLINIC_OR_DEPARTMENT_OTHER): Payer: Self-pay

## 2024-05-07 ENCOUNTER — Other Ambulatory Visit: Payer: Self-pay

## 2024-05-29 ENCOUNTER — Ambulatory Visit (INDEPENDENT_AMBULATORY_CARE_PROVIDER_SITE_OTHER): Admitting: Family Medicine

## 2024-05-29 ENCOUNTER — Other Ambulatory Visit: Payer: Self-pay

## 2024-05-29 ENCOUNTER — Other Ambulatory Visit (HOSPITAL_BASED_OUTPATIENT_CLINIC_OR_DEPARTMENT_OTHER): Payer: Self-pay

## 2024-05-29 ENCOUNTER — Encounter (INDEPENDENT_AMBULATORY_CARE_PROVIDER_SITE_OTHER): Payer: Self-pay | Admitting: Family Medicine

## 2024-05-29 VITALS — BP 118/70 | HR 64 | Temp 97.6°F | Ht 69.0 in | Wt 174.0 lb

## 2024-05-29 DIAGNOSIS — Z6825 Body mass index (BMI) 25.0-25.9, adult: Secondary | ICD-10-CM | POA: Diagnosis not present

## 2024-05-29 DIAGNOSIS — E66811 Obesity, class 1: Secondary | ICD-10-CM

## 2024-05-29 DIAGNOSIS — J302 Other seasonal allergic rhinitis: Secondary | ICD-10-CM | POA: Diagnosis not present

## 2024-05-29 DIAGNOSIS — E6609 Other obesity due to excess calories: Secondary | ICD-10-CM

## 2024-05-29 DIAGNOSIS — Z6828 Body mass index (BMI) 28.0-28.9, adult: Secondary | ICD-10-CM

## 2024-05-29 DIAGNOSIS — Z6826 Body mass index (BMI) 26.0-26.9, adult: Secondary | ICD-10-CM

## 2024-05-29 MED ORDER — ZEPBOUND 7.5 MG/0.5ML ~~LOC~~ SOAJ
7.5000 mg | SUBCUTANEOUS | 2 refills | Status: DC
  Filled 2024-05-29: qty 2, 28d supply, fill #0
  Filled 2024-06-27: qty 2, 28d supply, fill #1
  Filled 2024-07-30: qty 2, 28d supply, fill #2

## 2024-05-29 NOTE — Progress Notes (Signed)
 Brett Wilcox, D.O.  ABFM, ABOM Specializing in Clinical Bariatric Medicine  Office located at: [address] [place], [place]  [address]    Assessment and Plan:   Medications Discontinued During This Encounter  Medication Reason   tirzepatide  (ZEPBOUND ) 7.5 MG/0.5ML Pen Reorder    Meds ordered this encounter  Medications   tirzepatide  (ZEPBOUND ) 7.5 MG/0.5ML Pen    Sig: Inject 7.5 mg into the skin once a week.    Dispense:  2 mL    Refill:  2      FOR THE DISEASE OF OBESITY:  Class 1 obesity due to excess calories with serious comorbidity and body mass index (BMI) of 33.0 to 33.9 in adult - start 33.36 BMI 28.0-28.9,adult- current bmi 25.68 Assessment & Plan: Since last office visit on 04/03/24 patient's muscle mass has decreased by 0.6 lbs. Fat mass has decreased by 1.6 lbs. Total body water has decreased by 1.2 lbs.  Body fat % has decreased by 0.7%. Counseling done on how various foods will affect these numbers and how to maximize success. Highest body fat % was 35.5 on 04/01/21. Since then, he has lost 13.7%, with today being his lowest body fat % reading since starting the program, at 21.8%. Highest fat mass was 75.8 lbs on 04/01/21. Since then, he has lost 37.6 lbs, with today being his lowest fat mass readings since starting the program, at 38.2 lbs.   Total lbs lost to date: 52 lbs Total weight loss percentage to date: -23.01 %   Recommended Dietary Goals Brett Wilcox is currently in the action stage of change. As such, his goal is to continue weight management plan.  He has agreed to: continue current plan   Behavioral Intervention We discussed the following today: continue to work on maintaining a reduced calorie state, getting the recommended amount of protein, incorporating whole foods, making healthy choices, staying well hydrated and practicing mindfulness when eating. Properly hydrate at 1/2 of body weight in ounces of water, Need to be in a 500 cal deficit  daily to lose 1 lb of fat per week, consider journaling (MyFitnessPal, Lose It!, and Conometer), focus on getting healthy and not so much wt loss, increasing lean meats, and focus on additional lifestyle changes to continue prioritizing his overall physical health.    Additional resources provided today: Handout on Common Characteristics of Successful Weight Losers and Maintainers   Evidence-based interventions for health behavior change were utilized today including the discussion of self monitoring techniques, problem-solving barriers and SMART goal setting techniques.   Regarding patient's less desirable eating habits and patterns, we employed the technique of small changes.   Pt will specifically work on: n/a   Recommended Physical Activity Goals Brett Wilcox has been advised to work up to 300-450 minutes of moderate intensity aerobic activity a week and strengthening exercises 2-3 times per week for cardiovascular health, weight loss maintenance and preservation of muscle mass.   He has agreed to: Continue current level of physical activity    Pharmacotherapy Currently on Zepbound  7.5 mg once daily with good compliance and tolerance. Denies any Gi upset or adverse SE. Highest body fat % was 35.5 on 04/01/21. Since then, he has lost 13.7%, with today being his lowest body fat % reading since starting the program, at 21.8%. Highest fat mass was 75.8 lbs on 04/01/21. Since then, he has lost 37.6 lbs, with today being his lowest fat mass readings since starting the program, at 38.2 lbs.   Continue  current nutritional and behavioral strategies. Continue with Zepbound  at current dose, will refill today. Will continue monitoring condition.    ASSOCIATED CONDITIONS ADDRESSED TODAY:  Seasonal allergies Assessment & Plan: Relevant medications: Singulair  10 mg once daily, Xyzal   5 mg once daily, and Flonase . Good tolerance reported. Condition is overall well controlled. Continue current regimen as  prescribed. No refill required today. Will continue monitoring condition as it relates to his weight loss journey.     Follow up:   Return in about 10 weeks (around 08/07/2024) for f/u in 10 weeks (2.5 months) . He was informed of the importance of frequent follow up visits to maximize his success with intensive lifestyle modifications for his multiple health conditions.  Subjective:   Chief complaint: Obesity Brett Wilcox is here to discuss his progress with his obesity treatment plan. He is on the Category 2 Plan w B/L options; 6 ounces at L and 8-10 ounces at D and states he is following his eating plan approximately 75% of the time. He states he is playing pickle ball, tennis, and weight lifting for 60-90 minutes 5-7 days.    Interval History:  Brett Wilcox is here for a follow up office visit. Since last OV on 04/03/24, he is down 3 lbs.  He has been eating healthy on plan options for breakfast and lunch. Reports drinking some alcohol throughout the week and at times is snacking on pretzels after work. Overall, he has been doing well and is focused on gradual weight loss.    Pharmacotherapy that aid with weight loss: He is currently taking Zepbound  7.5 mg once weekly.    Review of Systems:  Pertinent positives were addressed with patient today.  Reviewed by clinician on day of visit: allergies, medications, problem list, medical history, surgical history, family history, social history, and previous encounter notes.  Weight Summary and Biometrics   Weight Lost Since Last Visit: 3lb  Weight Gained Since Last Visit: 0lb    Vitals Temp: 97.6 F (36.4 C) BP: 118/70 Pulse Rate: 64 SpO2: 98 %   Anthropometric Measurements Height: 5' 9 (1.753 m) Weight: 174 lb (78.9 kg) BMI (Calculated): 25.68 Weight at Last Visit: 177lb Weight Lost Since Last Visit: 3lb Weight Gained Since Last Visit: 0lb Starting Weight: 226lb Total Weight Loss (lbs): 52 lb (23.6 kg)   Body  Composition  Body Fat %: 21.8 % Fat Mass (lbs): 38.2 lbs Muscle Mass (lbs): 129.8 lbs Total Body Water (lbs): 87.8 lbs Visceral Fat Rating : 11   Other Clinical Data Fasting: no Labs: no Today's Visit #: 35 Starting Date: 02/25/21    Objective:   PHYSICAL EXAM: Blood pressure 118/70, pulse 64, temperature 97.6 F (36.4 C), height 5' 9 (1.753 m), weight 174 lb (78.9 kg), SpO2 98%. Body mass index is 25.7 kg/m.  General: he is overweight, cooperative and in no acute distress. PSYCH: Has normal mood, affect and thought process.   HEENT: EOMI, sclerae are anicteric. Lungs: Normal breathing effort, no conversational dyspnea. Extremities: Moves * 4 Neurologic: A and O * 3, good insight  DIAGNOSTIC DATA REVIEWED: BMET    Component Value Date/Time   NA 140 01/23/2024 0941   K 4.2 01/23/2024 0941   CL 102 01/23/2024 0941   CO2 31 (A) 01/23/2024 0941   GLUCOSE 79 08/28/2023 1010   GLUCOSE 85 11/17/2017 1331   BUN 16 01/23/2024 0941   CREATININE 0.9 01/23/2024 0941   CREATININE 0.87 08/28/2023 1010   CALCIUM 10.1 01/23/2024 0941  Lab Results  Component Value Date   HGBA1C 5.1 01/23/2024   HGBA1C 6.1 (H) 02/25/2021   Lab Results  Component Value Date   INSULIN  7.3 08/28/2023   INSULIN  23.8 02/25/2021   Lab Results  Component Value Date   TSH 2.71 01/23/2024   CBC    Component Value Date/Time   WBC 5.0 01/23/2024 0941   RBC 5.51 (A) 01/23/2024 0941   HGB 19.4 (H) 06/16/2021 0855   HCT 49 01/23/2024 0941   HCT 58.7 (H) 06/16/2021 0855   PLT 222 06/16/2021 0855   MCV 90 06/16/2021 0855   MCH 29.6 06/16/2021 0855   MCHC 33.0 06/16/2021 0855   RDW 14.7 06/16/2021 0855   Iron Studies No results found for: IRON, TIBC, FERRITIN, IRONPCTSAT Lipid Panel     Component Value Date/Time   CHOL 141 01/23/2024 0941   CHOL 138 08/28/2023 1010   TRIG 89 01/23/2024 0941   HDL 59 01/23/2024 0941   HDL 61 08/28/2023 1010   CHOLHDL 2.3 08/28/2023 1010    LDLCALC 68 01/23/2024 0941   LDLCALC 63 08/28/2023 1010   Hepatic Function Panel     Component Value Date/Time   PROT 7.0 08/28/2023 1010   ALBUMIN 4.9 01/23/2024 0941   ALBUMIN 4.8 08/28/2023 1010   AST 29 01/23/2024 0941   ALT 39 01/23/2024 0941   ALKPHOS 53 01/23/2024 0941   BILITOT 0.5 08/28/2023 1010      Component Value Date/Time   TSH 2.71 01/23/2024 0941   TSH 2.620 08/28/2023 1010   Nutritional Lab Results  Component Value Date   VD25OH 113.4 01/23/2024   VD25OH 98.1 08/28/2023   VD25OH 61.0 01/26/2023    Attestations:   I, Brett Wilcox, acting as a Stage manager for Brett Jenkins, DO., have compiled all relevant documentation for today's office visit on behalf of Brett Jenkins, DO, while in the presence of [employer], DO.  I have reviewed the above documentation for accuracy and completeness, and I agree with the above. Brett Wilcox, D.O.  The 21st Century Cures Act was signed into law in 2016 which includes the topic of electronic health records.  This provides immediate access to information in MyChart.  This includes consultation notes, operative notes, office notes, lab results and pathology reports.  If you have any questions about what you read please let us  know at your next visit so we can discuss your concerns and take corrective action if need be.  We are right here with you.

## 2024-06-12 DIAGNOSIS — D225 Melanocytic nevi of trunk: Secondary | ICD-10-CM | POA: Diagnosis not present

## 2024-06-12 DIAGNOSIS — L821 Other seborrheic keratosis: Secondary | ICD-10-CM | POA: Diagnosis not present

## 2024-06-12 DIAGNOSIS — L570 Actinic keratosis: Secondary | ICD-10-CM | POA: Diagnosis not present

## 2024-06-12 DIAGNOSIS — D2262 Melanocytic nevi of left upper limb, including shoulder: Secondary | ICD-10-CM | POA: Diagnosis not present

## 2024-06-12 DIAGNOSIS — L814 Other melanin hyperpigmentation: Secondary | ICD-10-CM | POA: Diagnosis not present

## 2024-06-13 DIAGNOSIS — S81811A Laceration without foreign body, right lower leg, initial encounter: Secondary | ICD-10-CM | POA: Diagnosis not present

## 2024-06-13 DIAGNOSIS — Z23 Encounter for immunization: Secondary | ICD-10-CM | POA: Diagnosis not present

## 2024-06-18 ENCOUNTER — Other Ambulatory Visit (INDEPENDENT_AMBULATORY_CARE_PROVIDER_SITE_OTHER): Payer: Self-pay | Admitting: Family Medicine

## 2024-06-18 DIAGNOSIS — E559 Vitamin D deficiency, unspecified: Secondary | ICD-10-CM

## 2024-06-27 ENCOUNTER — Other Ambulatory Visit: Payer: Self-pay

## 2024-06-27 ENCOUNTER — Other Ambulatory Visit (HOSPITAL_COMMUNITY): Payer: Self-pay

## 2024-07-03 ENCOUNTER — Other Ambulatory Visit (HOSPITAL_BASED_OUTPATIENT_CLINIC_OR_DEPARTMENT_OTHER): Payer: Self-pay

## 2024-07-03 MED ORDER — FLUZONE 0.5 ML IM SUSY
0.5000 mL | PREFILLED_SYRINGE | Freq: Once | INTRAMUSCULAR | 0 refills | Status: AC
  Filled 2024-07-03: qty 0.5, 1d supply, fill #0

## 2024-07-08 DIAGNOSIS — T792XXA Traumatic secondary and recurrent hemorrhage and seroma, initial encounter: Secondary | ICD-10-CM | POA: Diagnosis not present

## 2024-07-25 DIAGNOSIS — G4733 Obstructive sleep apnea (adult) (pediatric): Secondary | ICD-10-CM | POA: Diagnosis not present

## 2024-07-30 ENCOUNTER — Other Ambulatory Visit: Payer: Self-pay

## 2024-08-07 ENCOUNTER — Other Ambulatory Visit (HOSPITAL_BASED_OUTPATIENT_CLINIC_OR_DEPARTMENT_OTHER): Payer: Self-pay

## 2024-08-07 ENCOUNTER — Ambulatory Visit (INDEPENDENT_AMBULATORY_CARE_PROVIDER_SITE_OTHER): Payer: Self-pay | Admitting: Family Medicine

## 2024-08-07 ENCOUNTER — Encounter (INDEPENDENT_AMBULATORY_CARE_PROVIDER_SITE_OTHER): Payer: Self-pay | Admitting: Family Medicine

## 2024-08-07 VITALS — BP 119/80 | HR 98 | Temp 98.6°F | Ht 69.0 in | Wt 177.0 lb

## 2024-08-07 DIAGNOSIS — E038 Other specified hypothyroidism: Secondary | ICD-10-CM

## 2024-08-07 DIAGNOSIS — J302 Other seasonal allergic rhinitis: Secondary | ICD-10-CM

## 2024-08-07 DIAGNOSIS — L258 Unspecified contact dermatitis due to other agents: Secondary | ICD-10-CM | POA: Diagnosis not present

## 2024-08-07 DIAGNOSIS — E7849 Other hyperlipidemia: Secondary | ICD-10-CM | POA: Diagnosis not present

## 2024-08-07 DIAGNOSIS — E559 Vitamin D deficiency, unspecified: Secondary | ICD-10-CM | POA: Diagnosis not present

## 2024-08-07 DIAGNOSIS — R7303 Prediabetes: Secondary | ICD-10-CM | POA: Diagnosis not present

## 2024-08-07 DIAGNOSIS — E6609 Other obesity due to excess calories: Secondary | ICD-10-CM

## 2024-08-07 DIAGNOSIS — Z6826 Body mass index (BMI) 26.0-26.9, adult: Secondary | ICD-10-CM

## 2024-08-07 DIAGNOSIS — E66811 Other obesity due to excess calories: Secondary | ICD-10-CM

## 2024-08-07 MED ORDER — FLUTICASONE PROPIONATE 50 MCG/ACT NA SUSP: NASAL | 2 refills | Status: AC

## 2024-08-07 MED ORDER — TRIAMCINOLONE ACETONIDE 0.1 % EX CREA: 1.0000 | TOPICAL_CREAM | Freq: Two times a day (BID) | CUTANEOUS | 0 refills | Status: AC

## 2024-08-07 MED ORDER — ZEPBOUND 7.5 MG/0.5ML ~~LOC~~ SOAJ
7.5000 mg | SUBCUTANEOUS | 2 refills | Status: DC
  Filled 2024-08-07 – 2024-08-22 (×2): qty 2, 28d supply, fill #0
  Filled 2024-09-19 – 2024-09-28 (×3): qty 2, 28d supply, fill #1

## 2024-08-07 NOTE — Progress Notes (Signed)
 Brett Wilcox, D.O.  ABFM, ABOM Specializing in Clinical Bariatric Medicine  Office located at: [address] [place], [place]  [address]    FOR THE CHRONIC DISEASE OF OBESITY:   Class 1 obesity due to excess calories with serious comorbidity and body mass index (BMI) of 33.0 to 33.9 in adult - start 33.36 BMI 26.0-26.9,adult - current 26.27  Weight Summary and Body Composition Analysis  Weight Lost Since Last Visit: 0lb  Weight Gained Since Last Visit: 3lb    Vitals Temp: 98.6 F (37 C) BP: 119/80 Pulse Rate: 98 SpO2: 100 %   Anthropometric Measurements Height: 5' 9 (1.753 m) Weight: 177 lb (80.3 kg) BMI (Calculated): 26.13 Weight at Last Visit: 174lb Weight Lost Since Last Visit: 0lb Weight Gained Since Last Visit: 3lb Starting Weight: 226lb Total Weight Loss (lbs): 49 lb (22.2 kg)   Body Composition  Body Fat %: 21.8 % Fat Mass (lbs): 38.6 lbs Muscle Mass (lbs): 131.6 lbs Total Body Water (lbs): 89.2 lbs Visceral Fat Rating : 11   Other Clinical Data Fasting: no Labs: no Today's Visit #: 36 Starting Date: 02/25/21    Chief complaint: Obesity  Interval History Brett Wilcox is here for a follow-up office visit to discuss his progress with his obesity treatment plan. He is on the Category 2 Plan with 6 ounces of lean protein at lunch and 8-10 ounces at dinner and states he is following his eating plan approximately 75 % of the time. He is going to the gym 60+  minutes 6 days per week  He has experienced a weight gain of 3 lbs since last OV on 05/29/2024.   His dietary and life habits include:  - Tracking Calories/Macros: no - he is not on a journaling plan  - Eating More Whole Foods: yes  - Adequate Protein Intake: yes  - Adequate Water Intake: yes  - Skipping Meals: no  - Sleeping 7-9 Hours/ Night: yes    05/29/24 08:00 08/07/24 08:00   Body Fat % 21.8 % 21.8 %  Muscle Mass (lbs) 129.8 lbs 131.6 lbs  Fat Mass (lbs) 38.2  lbs 38.6 lbs  Total Body Water (lbs) 87.8 lbs 89.2 lbs  Visceral Fat Rating  11 11   Counseling done on how various foods will affect these numbers and how to maximize success  Total Fat mass lost to date: - 35.4 lbs Total lbs lost to date: - 49 lbs Total weight loss percentage to date: -21.68 %   Nutritional and Behavioral Counseling:  We discussed the following today: increasing fiber rich foods, increasing water intake , work on managing stress, creating time for self-care and relaxation, and continue to work on implementation of reduced calorie nutritional plan  Additional resources provided today: n/a  Evidence-based interventions for health behavior change were utilized today including the discussion of self monitoring techniques, problem-solving barriers and SMART goal setting techniques.   Regarding patient's less desirable eating habits and patterns, we employed the technique of small changes.   SMART Goal(s) created today: n/a   Recommended Dietary Goals Brett Wilcox is currently in the action stage of change. As such, his goal is to continue weight management plan.  He has agreed to continue the CAT 2 MP with 6 ounces of lean protein at lunch and 8-10 ounces at dinner   Recommended Physical Activity Goals Brett Wilcox has been advised to work up to 300-450 minutes of moderate intensity aerobic activity a week and strengthening exercises 2-3 times  per week for cardiovascular health, weight loss maintenance and preservation of muscle mass.   He has agreed to: Continue current level of physical activity    Medical Interventions and Pharmacotherapy Previous Bariatric surgery: n/a Pharmacotherapy: On Zepbound  7.5 mg weekly with reported good compliance and tolerance. No complaints of excessive hunger and cravings. He occasionally experiences constipation. Discussed the importance of adequate daily water and fiber intake along with activity/ movement. Cont prudent nutritional plan. Cont  Zepbound  at same dose (refill today)   OBESITY RELATED CONDITIONS ADDRESSED TODAY:   Orders Placed This Encounter  Procedures   VITAMIN D  25 Hydroxy (Vit-D Deficiency, Fractures)   Hemoglobin A1c   TSH   T4, free   Comprehensive metabolic panel with GFR   Lipid panel    Medications Discontinued During This Encounter  Medication Reason   fluticasone  (FLONASE ) 50 MCG/ACT nasal spray Reorder   tirzepatide  (ZEPBOUND ) 7.5 MG/0.5ML Pen Reorder     Meds ordered this encounter  Medications   tirzepatide  (ZEPBOUND ) 7.5 MG/0.5ML Pen    Sig: Inject 7.5 mg into the skin once a week.    Dispense:  2 mL    Refill:  2   fluticasone  (FLONASE ) 50 MCG/ACT nasal spray    Sig: 1 spray each nostril BID after sinus rinse    Dispense:  1 mL    Refill:  2   triamcinolone cream (KENALOG) 0.1 %    Sig: Apply 1 Application topically 2 (two) times daily.    Dispense:  30 g    Refill:  0     Contact dermatitis due to other agent, unspecified contact dermatitis type Assessment & Plan: He reports injury approximately 1.5 months ago after falling on a boat. He was treated for a laceration on the anterior RLE. He also reports scraping his thigh during the fall and subsequently noted "blood pooling" behind the knee, with associated itching. On exam today, there is a 4-inch by 2.6-inch erythematous, slightly raised patch on the dorsal aspect of the left distal thigh. No increased warmth, tenderness, or localization noted. Will prescribe triamcinolone cream. Discussed that if the etiology is contact dermatitis, the cream should relieve itching; if not, the itching is likely related to the healing process and should resolve on its own. Patient verbalized understanding.    Pre-diabetes Assessment & Plan: Pre-DM managed with dietary and lifestyle interventions. Good control of hunger and cravings. No acute concerns. Continue balanced diet focusing on protein, fruits, and vegetables while limiting simple  carbohydrates.  Continue gym regimen.     Seasonal allergies Assessment & Plan: Allergy symptoms are controlled on Flonase  50 mcg BID, Montelukast  10 mg daily, and Xyxal 5 mg daily. No acute concerns. Continue regimen (refill Flonase  today) and low-inflammatory meal plan.     Vitamin D  deficiency Assessment & Plan: He has a history of Vit D deficiency. Not currently on supplementation. Will recheck levels today and provide any further guidance at next OV.    Other hyperlipidemia Assessment & Plan: He has a history of HLD, managed w/ rosuvastatin 10 mg daily.Cont statin therapy and  working  on nutrition plan -decreasing simple carbohydrates, increasing lean proteins, decreasing saturated fats and cholesterol , avoiding trans fats and exercise as able to promote weight loss, improve lipids and decrease cardiovascular risks. Recheck lipid panel today.    Other specified hypothyroidism Assessment & Plan: He has a history of hypothyroidism, managed w/ levothyroxine 88 mcg daily. Cont regimen. Will recheck T4 and TSH today.  Objective:   PHYSICAL EXAM: Blood pressure 119/80, pulse 98, temperature 98.6 F (37 C), height 5' 9 (1.753 m), weight 177 lb (80.3 kg), SpO2 100%. Body mass index is 26.14 kg/m.  General: he is overweight, cooperative and in no acute distress. PSYCH: Has normal mood, affect and thought process.   HEENT: EOMI, sclerae are anicteric. Lungs: Normal breathing effort, no conversational dyspnea. Extremities: Moves * 4 Neurologic: A and O * 3, good insight  DIAGNOSTIC DATA REVIEWED: BMET    Component Value Date/Time   NA 140 01/23/2024 0941   K 4.2 01/23/2024 0941   CL 102 01/23/2024 0941   CO2 31 (A) 01/23/2024 0941   GLUCOSE 79 08/28/2023 1010   GLUCOSE 85 11/17/2017 1331   BUN 16 01/23/2024 0941   CREATININE 0.9 01/23/2024 0941   CREATININE 0.87 08/28/2023 1010   CALCIUM 10.1 01/23/2024 0941   Lab Results  Component Value Date   HGBA1C 5.1  01/23/2024   HGBA1C 6.1 (H) 02/25/2021   Lab Results  Component Value Date   INSULIN  7.3 08/28/2023   INSULIN  23.8 02/25/2021   Lab Results  Component Value Date   TSH 2.71 01/23/2024   CBC    Component Value Date/Time   WBC 5.0 01/23/2024 0941   RBC 5.51 (A) 01/23/2024 0941   HGB 19.4 (H) 06/16/2021 0855   HCT 49 01/23/2024 0941   HCT 58.7 (H) 06/16/2021 0855   PLT 222 06/16/2021 0855   MCV 90 06/16/2021 0855   MCH 29.6 06/16/2021 0855   MCHC 33.0 06/16/2021 0855   RDW 14.7 06/16/2021 0855   Iron Studies No results found for: IRON, TIBC, FERRITIN, IRONPCTSAT Lipid Panel     Component Value Date/Time   CHOL 141 01/23/2024 0941   CHOL 138 08/28/2023 1010   TRIG 89 01/23/2024 0941   HDL 59 01/23/2024 0941   HDL 61 08/28/2023 1010   CHOLHDL 2.3 08/28/2023 1010   LDLCALC 68 01/23/2024 0941   LDLCALC 63 08/28/2023 1010   Hepatic Function Panel     Component Value Date/Time   PROT 7.0 08/28/2023 1010   ALBUMIN 4.9 01/23/2024 0941   ALBUMIN 4.8 08/28/2023 1010   AST 29 01/23/2024 0941   ALT 39 01/23/2024 0941   ALKPHOS 53 01/23/2024 0941   BILITOT 0.5 08/28/2023 1010      Component Value Date/Time   TSH 2.71 01/23/2024 0941   TSH 2.620 08/28/2023 1010   Nutritional Lab Results  Component Value Date   VD25OH 113.4 01/23/2024   VD25OH 98.1 08/28/2023   VD25OH 61.0 01/26/2023     Follow up:   Return 10/07/2024 at 9:00 AM.  He was informed of the importance of frequent follow up visits to maximize his success with intensive lifestyle modifications for his multiple health conditions.  Brett Wilcox is aware that we will review all of his lab results at our next visit together in person.  He is aware that if anything is critical/ life threatening with the results, we will be contacting him via MyChart or by my CMA will be calling them prior to the office visit to discuss acute management.     Attestations:   I, Special Puri, acting as a cytogeneticist for [employer], DO., have compiled all relevant documentation for today's office visit on behalf of Brett Jenkins, DO, while in the presence of [employer], DO.  Pertinent positives were addressed with patient today. Reviewed by clinician on day of visit: allergies, medications, problem list,  medical history, surgical history, family history, social history, and previous encounter notes.  I have reviewed the above documentation for accuracy and completeness, and I agree with the above. Brett Wilcox, D.O.  The 21st Century Cures Act was signed into law in 2016 which includes the topic of electronic health records.  This provides immediate access to information in MyChart. This includes consultation notes, operative notes, office notes, lab results and pathology reports.  If you have any questions about what you read please let us  know at your next visit so we can discuss your concerns and take corrective action if need be.  We are right here with you.

## 2024-08-08 LAB — COMPREHENSIVE METABOLIC PANEL WITH GFR
ALT: 37 IU/L (ref 0–44)
AST: 31 IU/L (ref 0–40)
Albumin: 4.8 g/dL (ref 3.8–4.9)
Alkaline Phosphatase: 56 IU/L (ref 47–123)
BUN/Creatinine Ratio: 14 (ref 9–20)
BUN: 16 mg/dL (ref 6–24)
Bilirubin Total: 0.5 mg/dL (ref 0.0–1.2)
CO2: 26 mmol/L (ref 20–29)
Calcium: 9.6 mg/dL (ref 8.7–10.2)
Chloride: 97 mmol/L (ref 96–106)
Creatinine, Ser: 1.12 mg/dL (ref 0.76–1.27)
Globulin, Total: 2.2 g/dL (ref 1.5–4.5)
Glucose: 80 mg/dL (ref 70–99)
Potassium: 4.4 mmol/L (ref 3.5–5.2)
Sodium: 137 mmol/L (ref 134–144)
Total Protein: 7 g/dL (ref 6.0–8.5)
eGFR: 77 mL/min/1.73 (ref >59)

## 2024-08-08 LAB — LIPID PANEL
Chol/HDL Ratio: 2.3 ratio (ref 0.0–5.0)
Cholesterol, Total: 145 mg/dL (ref 100–199)
HDL: 62 mg/dL (ref >39)
LDL Chol Calc (NIH): 64 mg/dL (ref 0–99)
Triglycerides: 102 mg/dL (ref 0–149)
VLDL Cholesterol Cal: 19 mg/dL (ref 5–40)

## 2024-08-08 LAB — VITAMIN D 25 HYDROXY (VIT D DEFICIENCY, FRACTURES): Vit D, 25-Hydroxy: 50.4 ng/mL (ref 30.0–100.0)

## 2024-08-08 LAB — T4, FREE: Free T4: 1.51 ng/dL (ref 0.82–1.77)

## 2024-08-08 LAB — HEMOGLOBIN A1C
Est. average glucose Bld gHb Est-mCnc: 100 mg/dL
Hgb A1c MFr Bld: 5.1 % (ref 4.8–5.6)

## 2024-08-08 LAB — TSH: TSH: 3.2 u[IU]/mL (ref 0.450–4.500)

## 2024-08-19 ENCOUNTER — Ambulatory Visit

## 2024-08-19 ENCOUNTER — Ambulatory Visit: Admitting: Podiatry

## 2024-08-19 VITALS — Ht 69.0 in | Wt 177.0 lb

## 2024-08-19 DIAGNOSIS — M21619 Bunion of unspecified foot: Secondary | ICD-10-CM | POA: Diagnosis not present

## 2024-08-19 DIAGNOSIS — M79674 Pain in right toe(s): Secondary | ICD-10-CM | POA: Diagnosis not present

## 2024-08-19 DIAGNOSIS — S92424A Nondisplaced fracture of distal phalanx of right great toe, initial encounter for closed fracture: Secondary | ICD-10-CM | POA: Diagnosis not present

## 2024-08-19 NOTE — Patient Instructions (Signed)

## 2024-08-19 NOTE — Progress Notes (Signed)
  Subjective:  Patient ID: Brett Wilcox, male    DOB: 08/30/1966,  MRN: [id number]  Chief Complaint  Patient presents with   Foot Problem    Rm 14 RE SIGN SURGICAL CONSENT FOR SURGERY ON 10/09/24    History of Present Illness Brett Wilcox is a 58 year old male reevaluation of bunion on the right foot.  Scheduled for surgery in January.  He wants to proceed with having surgery.  He also reports a history previously where he fell injuring his toe.  He had other issues that were addressed at the emergency department but he never had an x-ray of the foot.  He states the toe did become very bruised and tender.   No history of blood clots.  Currently no blood thinners.   ROS: Negative except otherwise noted  Results RADIOLOGY Foot X-ray: Bunion deformity is present.  There is a healing fracture noted along the distal phalanx.   Assessment:   1. Bunion      Plan:  Patient was evaluated and treated and all questions answered.  Assessment and Plan Assessment & Plan Bunion, right foot -We discussed with conservative as well as surgical treatment options.  From a conservative standpoint we discussed supportive shoes with arch support.  Also use a toe spacer. given ongoing bunion deformity we discussed surgery as well and ultimately he wants to proceed with this.  We discussed different options and his decision for which procedure to be done will be determined intraoperatively.  We discussed more traditional bunion surgery with an Massie Croak versus an MTPJ arthrodesis should there be significant arthritic changes present the first MTPJ. - The incision placement as well as the postoperative course was discussed with the patient. I discussed risks of the surgery which include, but not limited to, infection, bleeding, pain, swelling, need for further surgery, delayed or nonhealing, painful or ugly scar, numbness or sensation changes, over/under correction, recurrence, transfer lesions,  further deformity, hardware failure, DVT/PE, loss of toe/foot. Patient understands these risks and wishes to proceed with surgery. The surgical consent was reviewed with the patient all 3 pages were signed. No promises or guarantees were given to the outcome of the procedure. All questions were answered to the best of my ability. Before the surgery the patient was encouraged to call the office if there is any further questions. The surgery will be performed at the [facility] on an outpatient basis.  Toe fracture - There are signs of healing and symptoms of improved.  Continue with supportive shoe gear and monitor.   Donnice JONELLE Fees DPM

## 2024-08-22 ENCOUNTER — Other Ambulatory Visit: Payer: Self-pay

## 2024-08-22 ENCOUNTER — Other Ambulatory Visit (HOSPITAL_COMMUNITY): Payer: Self-pay

## 2024-09-19 ENCOUNTER — Other Ambulatory Visit: Payer: Self-pay

## 2024-09-19 ENCOUNTER — Other Ambulatory Visit (HOSPITAL_COMMUNITY): Payer: Self-pay

## 2024-09-21 ENCOUNTER — Other Ambulatory Visit (HOSPITAL_COMMUNITY): Payer: Self-pay

## 2024-09-23 ENCOUNTER — Other Ambulatory Visit: Payer: Self-pay

## 2024-09-27 ENCOUNTER — Other Ambulatory Visit: Payer: Self-pay

## 2024-09-27 ENCOUNTER — Other Ambulatory Visit (HOSPITAL_COMMUNITY): Payer: Self-pay

## 2024-09-27 ENCOUNTER — Other Ambulatory Visit (HOSPITAL_BASED_OUTPATIENT_CLINIC_OR_DEPARTMENT_OTHER): Payer: Self-pay

## 2024-09-28 ENCOUNTER — Other Ambulatory Visit (HOSPITAL_COMMUNITY): Payer: Self-pay

## 2024-10-02 ENCOUNTER — Telehealth: Payer: Self-pay | Admitting: Podiatry

## 2024-10-02 NOTE — Telephone Encounter (Signed)
 DOS- 10/09/2024  HALLUX MPJ FUSION RT- [id number]  [employer] EFFECTIVE DATE- 08/03/2024  DEDUCTIBLE- $3500 REMAINING- $3500 OOP- $7000 REMAINING- $6800.86 COINSURANCE- 40%  PER CARELON PORTAL, PRIOR AUTH FOR CPT CODE 71249 HAS BEEN APPROVED FROM 10/09/2024-12/07/2024. AUTH# [id number]

## 2024-10-07 ENCOUNTER — Encounter (INDEPENDENT_AMBULATORY_CARE_PROVIDER_SITE_OTHER): Payer: Self-pay | Admitting: Family Medicine

## 2024-10-07 ENCOUNTER — Other Ambulatory Visit (HOSPITAL_BASED_OUTPATIENT_CLINIC_OR_DEPARTMENT_OTHER): Payer: Self-pay

## 2024-10-07 ENCOUNTER — Ambulatory Visit (INDEPENDENT_AMBULATORY_CARE_PROVIDER_SITE_OTHER): Admitting: Family Medicine

## 2024-10-07 VITALS — BP 111/72 | HR 75 | Temp 98.1°F | Ht 69.0 in | Wt 176.0 lb

## 2024-10-07 DIAGNOSIS — E038 Other specified hypothyroidism: Secondary | ICD-10-CM

## 2024-10-07 DIAGNOSIS — E559 Vitamin D deficiency, unspecified: Secondary | ICD-10-CM

## 2024-10-07 DIAGNOSIS — E6609 Other obesity due to excess calories: Secondary | ICD-10-CM

## 2024-10-07 DIAGNOSIS — Z8639 Personal history of other endocrine, nutritional and metabolic disease: Secondary | ICD-10-CM

## 2024-10-07 DIAGNOSIS — R7303 Prediabetes: Secondary | ICD-10-CM | POA: Diagnosis not present

## 2024-10-07 DIAGNOSIS — E7849 Other hyperlipidemia: Secondary | ICD-10-CM

## 2024-10-07 DIAGNOSIS — Z6826 Body mass index (BMI) 26.0-26.9, adult: Secondary | ICD-10-CM

## 2024-10-07 DIAGNOSIS — R7401 Elevation of levels of liver transaminase levels: Secondary | ICD-10-CM | POA: Diagnosis not present

## 2024-10-07 DIAGNOSIS — Z6825 Body mass index (BMI) 25.0-25.9, adult: Secondary | ICD-10-CM | POA: Diagnosis not present

## 2024-10-07 DIAGNOSIS — E66811 Obesity, class 1: Secondary | ICD-10-CM

## 2024-10-07 MED ORDER — ZEPBOUND 10 MG/0.5ML ~~LOC~~ SOAJ
10.0000 mg | SUBCUTANEOUS | 2 refills | Status: AC
  Filled 2024-10-07: qty 2, 28d supply, fill #0

## 2024-10-07 NOTE — Progress Notes (Signed)
 "  Brett Wilcox, D.O.  ABFM, ABOM Specializing in Clinical Bariatric Medicine  Office located at: [address] [place], [place]  [address]      A) FOR THE CHRONIC DISEASE OF OBESITY:  Class 1 obesity and body mass index (BMI) of 33.0 to 33.9 in adult - start 33.36 BMI 26.0-26.9,adult - current 25.98  Chief complaint: Obesity Brett Wilcox is here to discuss his progress with his obesity treatment plan.   History of present illness / Interval history:  Brett Wilcox is here today for his follow-up office visit.  Since last OV on 08/07/2024, pt is down 1 lb.   Has foot surgery scheduled on Wednesday and thus has been off of medication during this week.    08/07/24 08:00 10/07/24 08:00   Body Fat % 21.8 % 21.9 %  Muscle Mass (lbs) 131.6 lbs 131.2 lbs  Fat Mass (lbs) 38.6 lbs 38.6 lbs  Total Body Water (lbs) 89.2 lbs 89 lbs  Visceral Fat Rating  11 11  Counseling done on how various foods will affect these numbers and how to maximize success   Total lbs lost to date: -50 lbs Total Fat Mass in lbs lost to date: -35.4 Total weight loss percentage to date: -22.12 %   Nutrition Therapy He is on the CAT 2 MP with 6 ounces of lean protein at lunch and 8-10 ounces at dinner and states he is following his eating plan approximately 75 % of the time.   - Tracking Calories/Macros: no - ***  - Eating More Whole Foods: yes  - Adequate Protein Intake: yes  - Adequate Water Intake: no - ***  - Skipping Meals: no - ***  - Sleeping 7-9 Hours/ Night: yes   Brett Wilcox is currently in the action stage of change. As such, his goal is to continue weight management plan.  He has agreed to: continue current plan   Physical Activity Pt is doing weight training 60 minutes 4 days per week and  playing tennis or pickle ball 90 minutes 2 days per week.    Brett Wilcox has been advised to work up to 300-450 minutes of moderate intensity aerobic activity a week and strengthening exercises 2-3  times per week for cardiovascular health, weight loss maintenance and preservation of muscle mass.  He has agreed to : Continue current level of physical activity , Increase volume of physical activity to a goal of 240 minutes a week, and Combine aerobic and strengthening exercises for efficiency and improved cardiometabolic health.   Behavioral Modifications Evidence-based interventions for health behavior change were utilized today including the discussion of  1) self monitoring techniques:  n/a 2) problem-solving barriers:  n/a 3) self care:  exercise 4) SMART goals for next OV:  Maintain muscle and shed fat.  Regarding patient's less desirable eating habits and patterns, we employed the technique of small changes.   We discussed the following today: increasing water intake , continue to work on implementation of reduced calorie nutritional plan, and focusing on food with a 10:1 ratio of calories: grams of protein Additional resources provided today: None   Medical Interventions/ Pharmacotherapy Previous Bariatric surgery: none Pharmacotherapy for weight loss: He is currently taking Zepbound  7.5 mg once weekly for medical weight loss.    Is off medication this week because he's scheduled for foot surgery on Wednesday. Since he's been off the medication has experienced increased food noise. Increase dose from 7.5 mg once weekly to 10 mg once weekly. Recommended,  that because he will be off medication for a couple weeks due to surgery to cont with 7.5 mg dose for a couple weeks and then start the 10 mg dose.   We discussed various medication options to help Brett Wilcox with his weight loss efforts and we both agreed to : Increase Zepbound  from 7.5 mg once weekly to 10 mg once weekly.    B) OBESITY RELATED CONDITIONS ADDRESSED TODAY:   Pre-diabetes Assessment & Plan Lab Results  Component Value Date   HGBA1C 5.1 08/07/2024   HGBA1C 5.1 01/23/2024   HGBA1C 5.3 08/28/2023   INSULIN  7.3  08/28/2023   INSULIN  6.3 01/26/2023   INSULIN  13.6 01/27/2022   Lab Results  Component Value Date   CREATININE 1.12 08/07/2024   BUN 16 08/07/2024   NA 137 08/07/2024   K 4.4 08/07/2024   CL 97 08/07/2024   CO2 26 08/07/2024      Component Value Date/Time   PROT 7.0 08/07/2024 0939   ALBUMIN 4.8 08/07/2024 0939   AST 31 08/07/2024 0939   ALT 37 08/07/2024 0939   ALKPHOS 56 08/07/2024 0939   BILITOT 0.5 08/07/2024 0939  Managed with dietary and lifestyle interventions. Hunger and cravings are overall well-controlled. A1c is well controlled. No acute concerns. Cont prudent nutritional plan. Cont exercise.   Kidney function is WNL. Creatinine, Ser levels are slightly elevated at 1.12. Cont following low-sodium, heart-healthy diet. Increase water intake and exercise as able. We will cont to monitor.   Liver enzymes were elevated and they have improved. Liver enzymes are WNL. Cont decreasing simple carbs and sugars.    Other hyperlipidemia Assessment & Plan Lab Results  Component Value Date   CHOL 145 08/07/2024   HDL 62 08/07/2024   LDLCALC 64 08/07/2024   TRIG 102 08/07/2024   CHOLHDL 2.3 08/07/2024  Currently on Rosuvastatin 10 mg once daily with good compliance and tolerance. HDL, LDL, and Trigs are WNL. LDL was increased but has improved. HDL has improved. No acute concerns today. Cont decreasing saturated/trans fats and increasing lean proteins.    H/O Vitamin D  deficiency Assessment & Plan Lab Results  Component Value Date   VD25OH 50.4 08/07/2024   VD25OH 113.4 01/23/2024   VD25OH 98.1 08/28/2023  Has a history of VIT D deficiency. Not currently on supplementation. Vit D levels are well controlled. No acute concerns. Will recheck levels as necessary.     Other specified hypothyroidism Assessment & Plan Lab Results  Component Value Date   TSH 3.200 08/07/2024   FREET4 1.51 08/07/2024  Has a history of hypothyroidism, managed with leveothyroxine 88 mcg  daily. TSH and T4 levels are WNL. No acute concerns today. Cont regimen. Will cont to monitor.      H/O Elevated ALT measurement Assessment & Plan     Component Value Date/Time   PROT 7.0 08/07/2024 0939   ALBUMIN 4.8 08/07/2024 0939   AST 31 08/07/2024 0939   ALT 37 08/07/2024 0939   ALKPHOS 56 08/07/2024 0939   BILITOT 0.5 08/07/2024 0939  Previously had elevated ALT levels at 51 on 01/06/2023. ALT levels are well controlled now at 37. No acute concerns today. Cont decreasing fatty carbs and simple sugars. Increase exercise as able.      Medications Discontinued During This Encounter  Medication Reason   tirzepatide  (ZEPBOUND ) 7.5 MG/0.5ML Pen      Meds ordered this encounter  Medications   tirzepatide  (ZEPBOUND ) 10 MG/0.5ML Pen    Sig: Inject 10 mg into  the skin once a week.    Dispense:  2 mL    Refill:  2      Follow up:   Return 01/06/2025 10:40 AM.  He was informed of the importance of frequent follow up visits to maximize his success with intensive lifestyle modifications for his multiple health conditions.   Weight Summary and Biometrics   Weight Lost Since Last Visit: 1lb  Weight Gained Since Last Visit: 0lb    Vitals Temp: 98.1 F (36.7 C) BP: 111/72 Pulse Rate: 75 SpO2: 98 %   Anthropometric Measurements Height: 5' 9 (1.753 m) Weight: 176 lb (79.8 kg) BMI (Calculated): 25.98 Weight at Last Visit: 177lb Weight Lost Since Last Visit: 1lb Weight Gained Since Last Visit: 0lb Starting Weight: 226lb Total Weight Loss (lbs): 50 lb (22.7 kg)   Body Composition  Body Fat %: 21.9 % Fat Mass (lbs): 38.6 lbs Muscle Mass (lbs): 131.2 lbs Total Body Water (lbs): 89 lbs Visceral Fat Rating : 11   Other Clinical Data Fasting: no Labs: no Today's Visit #: 37 Starting Date: 02/25/21    Objective:   PHYSICAL EXAM: Blood pressure 111/72, pulse 75, temperature 98.1 F (36.7 C), height 5' 9 (1.753 m), weight 176 lb (79.8 kg), SpO2 98%. Body  mass index is 25.99 kg/m.  General: he is overweight, cooperative and in no acute distress. PSYCH: Has normal mood, affect and thought process.   HEENT: EOMI, sclerae are anicteric. Lungs: Normal breathing effort, no conversational dyspnea. Extremities: Moves * 4 Neurologic: A and O * 3, good insight  DIAGNOSTIC DATA REVIEWED: BMET    Component Value Date/Time   NA 137 08/07/2024 0939   K 4.4 08/07/2024 0939   CL 97 08/07/2024 0939   CO2 26 08/07/2024 0939   GLUCOSE 80 08/07/2024 0939   GLUCOSE 85 11/17/2017 1331   BUN 16 08/07/2024 0939   CREATININE 1.12 08/07/2024 0939   CALCIUM 9.6 08/07/2024 0939   Lab Results  Component Value Date   HGBA1C 5.1 08/07/2024   HGBA1C 6.1 (H) 02/25/2021   Lab Results  Component Value Date   INSULIN  7.3 08/28/2023   INSULIN  23.8 02/25/2021   Lab Results  Component Value Date   TSH 3.200 08/07/2024   CBC    Component Value Date/Time   WBC 5.0 01/23/2024 0941   RBC 5.51 (A) 01/23/2024 0941   HGB 19.4 (H) 06/16/2021 0855   HCT 49 01/23/2024 0941   HCT 58.7 (H) 06/16/2021 0855   PLT 222 06/16/2021 0855   MCV 90 06/16/2021 0855   MCH 29.6 06/16/2021 0855   MCHC 33.0 06/16/2021 0855   RDW 14.7 06/16/2021 0855   Iron Studies No results found for: IRON, TIBC, FERRITIN, IRONPCTSAT Lipid Panel     Component Value Date/Time   CHOL 145 08/07/2024 0939   TRIG 102 08/07/2024 0939   HDL 62 08/07/2024 0939   CHOLHDL 2.3 08/07/2024 0939   LDLCALC 64 08/07/2024 0939   Hepatic Function Panel     Component Value Date/Time   PROT 7.0 08/07/2024 0939   ALBUMIN 4.8 08/07/2024 0939   AST 31 08/07/2024 0939   ALT 37 08/07/2024 0939   ALKPHOS 56 08/07/2024 0939   BILITOT 0.5 08/07/2024 0939      Component Value Date/Time   TSH 3.200 08/07/2024 0939   Nutritional Lab Results  Component Value Date   VD25OH 50.4 08/07/2024   VD25OH 113.4 01/23/2024   VD25OH 98.1 08/28/2023    Attestations:   LILLETTE Hammond  Crista, acting as  a stage manager for Brett Jenkins, DO., have compiled all relevant documentation for today's office visit on behalf of Brett Jenkins, DO, while in the presence of [employer], DO.    I have reviewed the above documentation for accuracy and completeness, and I agree with the above. Brett JINNY Wilcox, D.O.  The 21st Century Cures Act was signed into law in 2016 which includes the topic of electronic health records.  This provides immediate access to information in MyChart.  This includes consultation notes, operative notes, office notes, lab results and pathology reports.  If you have any questions about what you read please let us  know at your next visit so we can discuss your concerns and take corrective action if need be.  We are right here with you.  "

## 2024-10-08 ENCOUNTER — Other Ambulatory Visit (HOSPITAL_COMMUNITY): Payer: Self-pay

## 2024-10-09 ENCOUNTER — Other Ambulatory Visit: Payer: Self-pay | Admitting: Podiatry

## 2024-10-09 DIAGNOSIS — M2011 Hallux valgus (acquired), right foot: Secondary | ICD-10-CM | POA: Diagnosis not present

## 2024-10-09 DIAGNOSIS — M2012 Hallux valgus (acquired), left foot: Secondary | ICD-10-CM | POA: Diagnosis not present

## 2024-10-09 MED ORDER — OXYCODONE-ACETAMINOPHEN 5-325 MG PO TABS: 1.0000 | ORAL_TABLET | Freq: Four times a day (QID) | ORAL | 0 refills | Status: AC | PRN

## 2024-10-09 MED ORDER — PROMETHAZINE HCL 25 MG PO TABS: 25.0000 mg | ORAL_TABLET | Freq: Three times a day (TID) | ORAL | 0 refills | Status: AC | PRN

## 2024-10-09 MED ORDER — CEPHALEXIN 500 MG PO CAPS: 500.0000 mg | ORAL_CAPSULE | Freq: Three times a day (TID) | ORAL | 0 refills | Status: AC

## 2024-10-11 ENCOUNTER — Telehealth: Payer: Self-pay | Admitting: Lab

## 2024-10-11 NOTE — Telephone Encounter (Signed)
 Patient left message confused if he should still be using his crutches or can he just walk in the boot?

## 2024-10-14 ENCOUNTER — Ambulatory Visit (INDEPENDENT_AMBULATORY_CARE_PROVIDER_SITE_OTHER)

## 2024-10-14 ENCOUNTER — Ambulatory Visit: Admitting: Podiatry

## 2024-10-14 DIAGNOSIS — S92424A Nondisplaced fracture of distal phalanx of right great toe, initial encounter for closed fracture: Secondary | ICD-10-CM | POA: Diagnosis not present

## 2024-10-14 DIAGNOSIS — M21611 Bunion of right foot: Secondary | ICD-10-CM

## 2024-10-14 MED ORDER — IBUPROFEN 800 MG PO TABS: 800.0000 mg | ORAL_TABLET | Freq: Three times a day (TID) | ORAL | 0 refills | Status: AC | PRN

## 2024-10-14 NOTE — Progress Notes (Signed)
 Subjective: Chief Complaint  Patient presents with   Post-op Follow-up    Patient presents today S/P T FOOT BUNION CORRECTION DOS 10/09/2024 Denies any fever    59 year old male presents For above concerns.  States that he had quite a bit of pain after the block wore off but since then his pain is subsided on taking 800 mg ibuprofen .  Currently not taking any narcotics.  Does not report any fevers or chills.  He did use the cam boot as well as 1 crutch.   Objective: AAO x3, NAD DP/PT pulses palpable bilaterally, CRT less than 3 seconds Right: Incision well coapted with sutures intact.  There is edema present to the area as well as ecchymosis.  There is no drainage or pus or increase in temperature.  There is no fluctuation or crepitation.  There is no malodor.  Mild tender to palpation at surgical site.  Toes rectus. No pain with calf compression, swelling, warmth, erythema  Assessment: Status post bunionectomy right foot  Plan: -All treatment options discussed with the patient including all alternatives, risks, complications.  -X-rays obtained reviewed.  Hardware intact for any complicating factors.  No evidence of acute fracture. -Incisions healing well.  Encouraged ice, elevation.  Prescribed  -Discussed that he can start to wash the foot with soap and water, dry thoroughly and apply a similar bandage with Xeroform and a dressing.  Dressing slides were given today.  Discussed not submerging the foot. -Monitor for any clinical signs or symptoms of infection and directed to call the office immediately should any occur or go to the ER. -Patient encouraged to call the office with any questions, concerns, change in symptoms.   No follow-ups on file.  Donnice JONELLE Fees DPM

## 2024-10-24 ENCOUNTER — Encounter: Payer: Self-pay | Admitting: Podiatry

## 2024-10-24 ENCOUNTER — Ambulatory Visit: Admitting: Podiatry

## 2024-10-24 DIAGNOSIS — M21611 Bunion of right foot: Secondary | ICD-10-CM

## 2024-10-26 NOTE — Progress Notes (Signed)
 Subjective: Chief Complaint  Patient presents with   Post-op Follow-up    Patient presents today S/P T FOOT BUNION CORRECTION DOS 10/09/2024 Denies any fever       59 year old male presents for the above concerns.  States he has been feeling better.  He is on taking ibuprofen  and Tylenol  and no narcotic pain medication.  He has been walking in the cam boot limited and using 1 crutch.  He does not report any fevers or chills or other concerns today.    Objective: AAO x3, NAD DP/PT pulses palpable bilaterally, CRT less than 3 seconds Right: Incision well coapted with sutures intact.  Mild edema still present but there is no surrounding erythema, ascending cellulitis but there is no drainage or pus.  There is no signs of infection noted today and there is no evidence of dehiscence.  Toes rectus.  Mild discomfort palpation of the surgical site. No pain with calf compression, swelling, warmth, erythema  Assessment: Status post bunionectomy right foot  Plan: -All treatment options discussed with the patient including all alternatives, risks, complications.  -Incisions healing well.  Discussed washing the foot with soap and water, dry thoroughly apply similar bandage.  Discussed range of motion exercises to gradually start incorporating on a regular basis.  Continue to ice, elevate as well as compression to help with the residual edema.  Continue with over-the-counter anti-inflammatories, Tylenol  as needed for pain. -Weight-bear as tolerated in surgical boot although limited. -Monitor for any clinical signs or symptoms of infection and directed to call the office immediately should any occur or go to the ER.  No follow-ups on file.  Brett Wilcox Fees DPM

## 2024-11-07 ENCOUNTER — Ambulatory Visit: Admitting: Podiatry

## 2024-11-07 ENCOUNTER — Ambulatory Visit

## 2024-11-07 DIAGNOSIS — M21611 Bunion of right foot: Secondary | ICD-10-CM

## 2024-11-21 ENCOUNTER — Encounter: Admitting: Podiatry

## 2025-01-06 ENCOUNTER — Ambulatory Visit (INDEPENDENT_AMBULATORY_CARE_PROVIDER_SITE_OTHER): Admitting: Family Medicine
